# Patient Record
Sex: Male | Born: 1975 | Race: White | Hispanic: No | Marital: Single | Smoking: Current every day smoker
Health system: Southern US, Community
[De-identification: ages and names within clinical notes are randomized; demographics above are authoritative.]

## PROBLEM LIST (undated history)

## (undated) DIAGNOSIS — M1990 Unspecified osteoarthritis, unspecified site: Secondary | ICD-10-CM

## (undated) DIAGNOSIS — K219 Gastro-esophageal reflux disease without esophagitis: Secondary | ICD-10-CM

## (undated) DIAGNOSIS — I10 Essential (primary) hypertension: Secondary | ICD-10-CM

## (undated) DIAGNOSIS — E7800 Pure hypercholesterolemia, unspecified: Secondary | ICD-10-CM

## (undated) DIAGNOSIS — F329 Major depressive disorder, single episode, unspecified: Secondary | ICD-10-CM

## (undated) DIAGNOSIS — J449 Chronic obstructive pulmonary disease, unspecified: Secondary | ICD-10-CM

## (undated) DIAGNOSIS — F32A Depression, unspecified: Secondary | ICD-10-CM

## (undated) HISTORY — PX: JOINT REPLACEMENT: SHX530

## (undated) HISTORY — PX: TONSILLECTOMY: SUR1361

---

## 2007-06-15 ENCOUNTER — Ambulatory Visit: Payer: Self-pay | Admitting: Internal Medicine

## 2007-07-27 ENCOUNTER — Ambulatory Visit: Payer: Self-pay | Admitting: Internal Medicine

## 2007-12-10 ENCOUNTER — Ambulatory Visit: Payer: Self-pay | Admitting: Family Medicine

## 2007-12-31 ENCOUNTER — Ambulatory Visit: Payer: Self-pay | Admitting: Family Medicine

## 2008-01-08 ENCOUNTER — Ambulatory Visit: Payer: Self-pay | Admitting: Emergency Medicine

## 2008-01-19 ENCOUNTER — Emergency Department: Payer: Self-pay | Admitting: Emergency Medicine

## 2008-04-22 ENCOUNTER — Ambulatory Visit: Payer: Self-pay | Admitting: Family Medicine

## 2008-06-24 ENCOUNTER — Ambulatory Visit: Payer: Self-pay | Admitting: Internal Medicine

## 2008-10-05 ENCOUNTER — Ambulatory Visit: Payer: Self-pay | Admitting: Internal Medicine

## 2008-10-19 ENCOUNTER — Ambulatory Visit: Payer: Self-pay | Admitting: Internal Medicine

## 2009-02-05 ENCOUNTER — Ambulatory Visit: Payer: Self-pay | Admitting: Family Medicine

## 2009-02-24 ENCOUNTER — Ambulatory Visit: Payer: Self-pay | Admitting: Internal Medicine

## 2009-03-15 ENCOUNTER — Ambulatory Visit: Payer: Self-pay | Admitting: Orthopedic Surgery

## 2009-06-23 ENCOUNTER — Ambulatory Visit: Payer: Self-pay | Admitting: Physician Assistant

## 2009-08-05 ENCOUNTER — Ambulatory Visit: Payer: Self-pay | Admitting: Family Medicine

## 2009-10-04 ENCOUNTER — Ambulatory Visit: Payer: Self-pay | Admitting: Internal Medicine

## 2010-01-13 ENCOUNTER — Ambulatory Visit: Payer: Self-pay | Admitting: Otolaryngology

## 2010-04-25 ENCOUNTER — Ambulatory Visit: Payer: Self-pay | Admitting: Anesthesiology

## 2010-04-28 ENCOUNTER — Ambulatory Visit: Payer: Self-pay | Admitting: Otolaryngology

## 2010-05-06 ENCOUNTER — Ambulatory Visit: Payer: Self-pay | Admitting: Cardiovascular Disease

## 2010-07-10 ENCOUNTER — Ambulatory Visit: Payer: Self-pay | Admitting: Internal Medicine

## 2010-07-12 ENCOUNTER — Ambulatory Visit: Payer: Self-pay | Admitting: Internal Medicine

## 2010-11-03 ENCOUNTER — Ambulatory Visit: Payer: Self-pay | Admitting: Family Medicine

## 2010-12-02 ENCOUNTER — Ambulatory Visit: Payer: Self-pay | Admitting: Physician Assistant

## 2010-12-13 ENCOUNTER — Ambulatory Visit: Payer: Self-pay | Admitting: Psychiatry

## 2011-01-06 ENCOUNTER — Ambulatory Visit: Payer: Self-pay | Admitting: Internal Medicine

## 2011-01-17 ENCOUNTER — Ambulatory Visit: Payer: Self-pay | Admitting: Unknown Physician Specialty

## 2011-03-04 ENCOUNTER — Ambulatory Visit: Payer: Self-pay | Admitting: Internal Medicine

## 2011-04-07 ENCOUNTER — Ambulatory Visit: Payer: Self-pay | Admitting: Physician Assistant

## 2011-04-14 ENCOUNTER — Ambulatory Visit: Payer: Self-pay | Admitting: Psychiatry

## 2011-05-20 ENCOUNTER — Ambulatory Visit: Payer: Self-pay

## 2011-06-05 ENCOUNTER — Ambulatory Visit: Payer: Self-pay | Admitting: Unknown Physician Specialty

## 2011-06-05 DIAGNOSIS — I10 Essential (primary) hypertension: Secondary | ICD-10-CM

## 2011-06-08 ENCOUNTER — Ambulatory Visit: Payer: Self-pay

## 2011-06-16 ENCOUNTER — Ambulatory Visit: Payer: Self-pay | Admitting: Psychiatry

## 2011-07-17 ENCOUNTER — Inpatient Hospital Stay: Payer: Self-pay | Admitting: Unknown Physician Specialty

## 2011-07-19 LAB — PATHOLOGY REPORT

## 2011-12-26 ENCOUNTER — Ambulatory Visit: Payer: Self-pay | Admitting: Otolaryngology

## 2012-02-20 ENCOUNTER — Ambulatory Visit: Payer: Self-pay | Admitting: Psychiatry

## 2012-02-20 LAB — BASIC METABOLIC PANEL
BUN: 7 mg/dL (ref 7–18)
Chloride: 100 mmol/L (ref 98–107)
Creatinine: 1.32 mg/dL — ABNORMAL HIGH (ref 0.60–1.30)
EGFR (Non-African Amer.): >60
Glucose: 117 mg/dL — ABNORMAL HIGH (ref 65–99)
Osmolality: 275 (ref 275–301)
Potassium: 4 mmol/L (ref 3.5–5.1)
Sodium: 138 mmol/L (ref 136–145)

## 2012-02-20 LAB — CBC WITH DIFFERENTIAL/PLATELET
Basophil #: 0 10*3/uL (ref 0.0–0.1)
Basophil %: 0.5 %
Eosinophil #: 0.4 10*3/uL (ref 0.0–0.7)
Eosinophil %: 5.1 %
HGB: 15 g/dL (ref 13.0–18.0)
Lymphocyte #: 2 10*3/uL (ref 1.0–3.6)
Lymphocyte %: 24 %
Monocyte #: 0.5 x10 3/mm (ref 0.2–1.0)
Monocyte %: 5.5 %
Neutrophil %: 64.9 %
Platelet: 247 10*3/uL (ref 150–440)
RBC: 5.11 10*6/uL (ref 4.40–5.90)
RDW: 14.8 % — ABNORMAL HIGH (ref 11.5–14.5)
WBC: 8.3 10*3/uL (ref 3.8–10.6)

## 2012-02-20 LAB — SGOT (AST)(ARMC): SGOT(AST): 33 U/L (ref 15–37)

## 2012-02-20 LAB — T4, FREE: Free Thyroxine: 1.14 ng/dL (ref 0.76–1.46)

## 2012-02-20 LAB — TSH: Thyroid Stimulating Horm: 0.942 u[IU]/mL

## 2012-06-04 DIAGNOSIS — F259 Schizoaffective disorder, unspecified: Secondary | ICD-10-CM | POA: Insufficient documentation

## 2012-06-12 ENCOUNTER — Ambulatory Visit: Payer: Self-pay | Admitting: Family Medicine

## 2012-10-03 ENCOUNTER — Ambulatory Visit: Payer: Self-pay

## 2013-02-10 DIAGNOSIS — N62 Hypertrophy of breast: Secondary | ICD-10-CM | POA: Insufficient documentation

## 2013-05-07 ENCOUNTER — Ambulatory Visit: Payer: Self-pay

## 2014-02-10 ENCOUNTER — Ambulatory Visit: Payer: Self-pay | Admitting: Family Medicine

## 2014-03-04 ENCOUNTER — Emergency Department: Payer: Self-pay | Admitting: Emergency Medicine

## 2014-03-04 LAB — COMPREHENSIVE METABOLIC PANEL
ALBUMIN: 3.4 g/dL (ref 3.4–5.0)
ALK PHOS: 66 U/L
ALT: 45 U/L
ANION GAP: 5 — AB (ref 7–16)
BILIRUBIN TOTAL: 0.5 mg/dL (ref 0.2–1.0)
BUN: 4 mg/dL — ABNORMAL LOW (ref 7–18)
CALCIUM: 8.5 mg/dL (ref 8.5–10.1)
CHLORIDE: 98 mmol/L (ref 98–107)
CREATININE: 1.09 mg/dL (ref 0.60–1.30)
Co2: 30 mmol/L (ref 21–32)
EGFR (African American): >60
Glucose: 129 mg/dL — ABNORMAL HIGH (ref 65–99)
Osmolality: 265 (ref 275–301)
Potassium: 3.8 mmol/L (ref 3.5–5.1)
SGOT(AST): 25 U/L (ref 15–37)
Sodium: 133 mmol/L — ABNORMAL LOW (ref 136–145)
Total Protein: 7 g/dL (ref 6.4–8.2)

## 2014-03-04 LAB — CBC WITH DIFFERENTIAL/PLATELET
Basophil #: 0 10*3/uL (ref 0.0–0.1)
Basophil %: 0.4 %
Eosinophil #: 0.5 10*3/uL (ref 0.0–0.7)
Eosinophil %: 4.9 %
HCT: 43.9 % (ref 40.0–52.0)
HGB: 14.3 g/dL (ref 13.0–18.0)
Lymphocyte #: 2.6 10*3/uL (ref 1.0–3.6)
Lymphocyte %: 24.5 %
MCH: 30.1 pg (ref 26.0–34.0)
MCHC: 32.5 g/dL (ref 32.0–36.0)
MCV: 93 fL (ref 80–100)
MONO ABS: 0.6 x10 3/mm (ref 0.2–1.0)
Monocyte %: 5.3 %
Neutrophil #: 7 10*3/uL — ABNORMAL HIGH (ref 1.4–6.5)
Neutrophil %: 64.9 %
Platelet: 243 10*3/uL (ref 150–440)
RBC: 4.74 10*6/uL (ref 4.40–5.90)
RDW: 13.9 % (ref 11.5–14.5)
WBC: 10.8 10*3/uL — ABNORMAL HIGH (ref 3.8–10.6)

## 2014-03-04 LAB — LIPASE, BLOOD: Lipase: 101 U/L (ref 73–393)

## 2014-04-01 ENCOUNTER — Ambulatory Visit: Payer: Self-pay | Admitting: Family Medicine

## 2014-04-20 ENCOUNTER — Emergency Department: Payer: Self-pay | Admitting: Emergency Medicine

## 2014-04-20 LAB — COMPREHENSIVE METABOLIC PANEL
ALK PHOS: 59 U/L
Albumin: 3.4 g/dL (ref 3.4–5.0)
Anion Gap: 7 (ref 7–16)
BILIRUBIN TOTAL: 0.5 mg/dL (ref 0.2–1.0)
BUN: 4 mg/dL — AB (ref 7–18)
CHLORIDE: 102 mmol/L (ref 98–107)
Calcium, Total: 8.5 mg/dL (ref 8.5–10.1)
Co2: 29 mmol/L (ref 21–32)
Creatinine: 0.94 mg/dL (ref 0.60–1.30)
EGFR (African American): >60
EGFR (Non-African Amer.): >60
GLUCOSE: 101 mg/dL — AB (ref 65–99)
Osmolality: 273 (ref 275–301)
Potassium: 4.3 mmol/L (ref 3.5–5.1)
SGOT(AST): 45 U/L — ABNORMAL HIGH (ref 15–37)
SGPT (ALT): 40 U/L
SODIUM: 138 mmol/L (ref 136–145)
TOTAL PROTEIN: 7.1 g/dL (ref 6.4–8.2)

## 2014-04-20 LAB — URINALYSIS, COMPLETE
BLOOD: NEGATIVE
Bacteria: NONE SEEN
Bilirubin,UR: NEGATIVE
GLUCOSE, UR: NEGATIVE mg/dL (ref 0–75)
Ketone: NEGATIVE
LEUKOCYTE ESTERASE: NEGATIVE
Nitrite: NEGATIVE
PROTEIN: NEGATIVE
Ph: 6 (ref 4.5–8.0)
RBC,UR: NONE SEEN /HPF (ref 0–5)
SPECIFIC GRAVITY: 1.004 (ref 1.003–1.030)
Squamous Epithelial: NONE SEEN
WBC UR: 1 /HPF (ref 0–5)

## 2014-04-20 LAB — CBC
HCT: 41.5 % (ref 40.0–52.0)
HGB: 14 g/dL (ref 13.0–18.0)
MCH: 30.5 pg (ref 26.0–34.0)
MCHC: 33.6 g/dL (ref 32.0–36.0)
MCV: 91 fL (ref 80–100)
Platelet: 261 10*3/uL (ref 150–440)
RBC: 4.58 10*6/uL (ref 4.40–5.90)
RDW: 13.5 % (ref 11.5–14.5)
WBC: 8.5 10*3/uL (ref 3.8–10.6)

## 2014-11-29 NOTE — Discharge Summary (Signed)
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   DISCHARGE DIAGNOSES:  1. 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  2. Acute blood loss anemia, improved after transfusion.  3. Hyponatremia, improved.   ATTENDING: Erin Sons, MD   PROCEDURE: On 07/17/2011 the patient underwent left total hip arthroplasty by Dr. Gavin Potters with assistant Thompson Grayer, PA-C.   ANESTHESIA: General with spinal anesthetic.  ESTIMATED BLOOD LOSS: 1200 mL.   OPERATIVE FINDINGS: Avascular necrosis.   IMPLANTS: [employer]ry[employer].  DRAINS: One Hemovac drain.  HISTORY OF PRESENT ILLNESS: Richard Davis is a 39 year old who had left upper leg/knee pain for quite some time. He had a left knee arthroscopy in June of 2012 that failed to relieve his symptoms. Later on x-rays of his hips were taken and did look somewhat suspicious. MRI revealed AVN of both hips. [facility] felt he was too far gone to have a vascularized fibula graft procedure. The patient is to undergo hip replacement. He had a prior left hip injection which was somewhat helpful.   ALLERGIES: No known drug allergies.    PAST MEDICAL HISTORY:  1. Schizophrenia. 2. Gastroesophageal reflux disease. 3. Migraines.  4. High cholesterol.  5. Hypertension.  6. Allergies. 7. Arthritis.  8. Chickenpox.  9. Recent sinusitis. 10. On chronic methadone, treated by a pain clinic in [place].  PHYSICAL EXAMINATION: HEART: S1 and S2 with increased rate but regular rhythm. LUNGS: Posterior fields clear to auscultation. LEFT HIP: Passive internal rotation is approximately 20 degrees with stiffness and external rotation is about 30 degrees with pain. Distally he has a good pulse and is able to move his ankle well.   HOSPITAL COURSE: The patient underwent the aforementioned  procedure without complication on 07/17/2011 and was transferred to the PACU and then the orthopedic floor in stable condition. He had some significant pain at first that would abate it seems. His IV fluids were switched because of his hyponatremia, to normal saline. At one point he was put on fluid restriction, but his sodium would normalize and this was taken off. He was treated with Lovenox, TED hose, and compression boots for deep vein thrombosis prophylaxis. He was ambulatory while here with physical therapy. His wound drain was pulled on postoperative day two and his dressing was changed on postoperative day three. There was no sign of infection about his wound site. His hemoglobin had dropped down to 6.3 on 07/19/2011 and he was transfused 2 units of packed red blood cells. Hemoglobin would be 8.1 on 07/20/2011.  He was started on iron and vitamin D supplement. The patient did work multiple times with physical therapy and also some with occupational therapy while here. On 07/21/2011 he ambulated 150 feet and worked on stairs.   CONDITION AT DISCHARGE: Stable.   DISPOSITION: Home with home health physical therapy.   DISCHARGE MEDICATIONS:  1. Oxycodone 5 to 10 mg every four hours as needed for pain.  2. Lovenox 40 mg subcutaneous injection once a day. 3. Methadone 10 mg/5 mL oral solution 1000 mL, take 30 mL orally every morning. This prescription will last him for 33 days. After this he needs to go back to the pain clinic in [place].   DISCHARGE INSTRUCTIONS AND FOLLOW-UP: 1. He may need to take a stool softener to have regular bowel movements.  2. He will  not resume naproxen until finishing Lovenox injections.  3. He is weight-bearing as tolerated on the surgical leg.  4. He will wear knee-high TED hose during the day.  5. Regular diet.  6. He may ice his left hip but will get his dressing dirty or wet. He will leave the dressing on.  7. He will call our office if he has any disturbing  symptoms.  8. He will not cross his left leg over or flex his hip to his chest.  9. He is to discontinue smoking to expedite his healing process.  10. [facility] for a follow-up appointment. ____________________________ Letta MoynahanJonathan R. Moani Weipert, [place] jrp:slb D: 07/21/2011 12:03:32 ET T: 07/21/2011 12:37:23 ET JOB#: [id number]  cc: Letta MoynahanJonathan R. Clyde CanterburyPrentice, GeorgiaPA, <Dictator> Letta MoynahanJONATHAN R Shaquasia Caponigro PA ELECTRONICALLY SIGNED 08/08/2011 9:23

## 2014-12-31 DIAGNOSIS — R0602 Shortness of breath: Secondary | ICD-10-CM | POA: Insufficient documentation
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
## 2015-01-27 ENCOUNTER — Ambulatory Visit
Admission: RE | Admit: 2015-01-27 | Discharge: 2015-01-27 | Disposition: A | Discharge status: Home / Self Care | Payer: Medicare Other | Source: Ambulatory Visit | Attending: Cardiology | Admitting: Cardiology

## 2015-01-27 ENCOUNTER — Encounter: Payer: Self-pay | Admitting: *Deleted

## 2015-01-27 ENCOUNTER — Encounter
Admission: RE | Disposition: A | Discharge status: Home / Self Care | Payer: Self-pay | Source: Ambulatory Visit | Attending: Cardiology

## 2015-01-27 DIAGNOSIS — R9439 Abnormal result of other cardiovascular function study: Secondary | ICD-10-CM

## 2015-01-27 DIAGNOSIS — K219 Gastro-esophageal reflux disease without esophagitis: Secondary | ICD-10-CM | POA: Insufficient documentation

## 2015-01-27 DIAGNOSIS — G8929 Other chronic pain: Secondary | ICD-10-CM | POA: Diagnosis not present

## 2015-01-27 DIAGNOSIS — I2584 Coronary atherosclerosis due to calcified coronary lesion: Secondary | ICD-10-CM | POA: Diagnosis not present

## 2015-01-27 DIAGNOSIS — Z79899 Other long term (current) drug therapy: Secondary | ICD-10-CM | POA: Diagnosis not present

## 2015-01-27 DIAGNOSIS — R079 Chest pain, unspecified: Secondary | ICD-10-CM | POA: Diagnosis present

## 2015-01-27 DIAGNOSIS — E785 Hyperlipidemia, unspecified: Secondary | ICD-10-CM | POA: Diagnosis not present

## 2015-01-27 DIAGNOSIS — R0602 Shortness of breath: Secondary | ICD-10-CM | POA: Diagnosis not present

## 2015-01-27 DIAGNOSIS — J329 Chronic sinusitis, unspecified: Secondary | ICD-10-CM | POA: Insufficient documentation

## 2015-01-27 DIAGNOSIS — Z7952 Long term (current) use of systemic steroids: Secondary | ICD-10-CM | POA: Insufficient documentation

## 2015-01-27 DIAGNOSIS — Z7951 Long term (current) use of inhaled steroids: Secondary | ICD-10-CM | POA: Diagnosis not present

## 2015-01-27 DIAGNOSIS — E780 Pure hypercholesterolemia: Secondary | ICD-10-CM | POA: Diagnosis not present

## 2015-01-27 DIAGNOSIS — I2510 Atherosclerotic heart disease of native coronary artery without angina pectoris: Secondary | ICD-10-CM | POA: Diagnosis not present

## 2015-01-27 DIAGNOSIS — I10 Essential (primary) hypertension: Secondary | ICD-10-CM | POA: Diagnosis not present

## 2015-01-27 DIAGNOSIS — M2550 Pain in unspecified joint: Secondary | ICD-10-CM | POA: Insufficient documentation

## 2015-01-27 DIAGNOSIS — Z79891 Long term (current) use of opiate analgesic: Secondary | ICD-10-CM | POA: Diagnosis not present

## 2015-01-27 DIAGNOSIS — Z7982 Long term (current) use of aspirin: Secondary | ICD-10-CM | POA: Diagnosis not present

## 2015-01-27 DIAGNOSIS — Z720 Tobacco use: Secondary | ICD-10-CM | POA: Insufficient documentation

## 2015-01-27 DIAGNOSIS — F17210 Nicotine dependence, cigarettes, uncomplicated: Secondary | ICD-10-CM | POA: Diagnosis not present

## 2015-01-27 HISTORY — DX: Gastro-esophageal reflux disease without esophagitis: K21.9

## 2015-01-27 HISTORY — DX: Depression, unspecified: F32.A

## 2015-01-27 HISTORY — DX: Essential (primary) hypertension: I10

## 2015-01-27 HISTORY — PX: CARDIAC CATHETERIZATION: SHX172

## 2015-01-27 HISTORY — DX: Major depressive disorder, single episode, unspecified: F32.9

## 2015-01-27 LAB — GLUCOSE, CAPILLARY: Glucose-Capillary: 127 mg/dL — ABNORMAL HIGH (ref 65–99)

## 2015-01-27 SURGERY — LEFT HEART CATH
Anesthesia: Moderate Sedation

## 2015-01-27 MED ORDER — SODIUM CHLORIDE 0.9 % IJ SOLN
3.0000 mL | Freq: Two times a day (BID) | INTRAMUSCULAR | Status: DC
Start: 2015-01-27 — End: 2015-01-27

## 2015-01-27 MED ORDER — SODIUM CHLORIDE 0.9 % IJ SOLN: 3.0000 mL | INTRAMUSCULAR | Status: DC | PRN

## 2015-01-27 MED ORDER — FENTANYL CITRATE (PF) 100 MCG/2ML IJ SOLN
INTRAMUSCULAR | Status: DC | PRN
  Administered 2015-01-27: 50 ug via INTRAVENOUS
  Administered 2015-01-27: 25 ug via INTRAVENOUS

## 2015-01-27 MED ORDER — ACETAMINOPHEN 325 MG PO TABS: 650.0000 mg | ORAL_TABLET | ORAL | Status: DC | PRN

## 2015-01-27 MED ORDER — MIDAZOLAM HCL 2 MG/2ML IJ SOLN
INTRAMUSCULAR | Status: DC | PRN
  Administered 2015-01-27: 2 mg via INTRAVENOUS

## 2015-01-27 MED ORDER — SODIUM CHLORIDE 0.9 % IJ SOLN: 3.0000 mL | Freq: Two times a day (BID) | INTRAMUSCULAR | Status: DC

## 2015-01-27 MED ORDER — ONDANSETRON HCL 4 MG/2ML IJ SOLN: 4.0000 mg | Freq: Four times a day (QID) | INTRAMUSCULAR | Status: DC | PRN

## 2015-01-27 MED ORDER — SODIUM CHLORIDE 0.9 % WEIGHT BASED INFUSION: 3.0000 mL/kg/h | INTRAVENOUS | Status: DC

## 2015-01-27 MED ORDER — SODIUM CHLORIDE 0.9 % IV SOLN
INTRAVENOUS | Status: DC
  Administered 2015-01-27: 08:00:00 via INTRAVENOUS

## 2015-01-27 MED ORDER — HEPARIN (PORCINE) IN NACL 2-0.9 UNIT/ML-% IJ SOLN
INTRAMUSCULAR | Status: AC
  Filled 2015-01-27: qty 500

## 2015-01-27 MED ORDER — MIDAZOLAM HCL 2 MG/2ML IJ SOLN
INTRAMUSCULAR | Status: AC
  Filled 2015-01-27: qty 2

## 2015-01-27 MED ORDER — FENTANYL CITRATE (PF) 100 MCG/2ML IJ SOLN
INTRAMUSCULAR | Status: AC
  Filled 2015-01-27: qty 2

## 2015-01-27 MED ORDER — SODIUM CHLORIDE 0.9 % IV SOLN: 250.0000 mL | INTRAVENOUS | Status: DC | PRN

## 2015-01-27 MED ORDER — IOHEXOL 300 MG/ML  SOLN
INTRAMUSCULAR | Status: DC | PRN
  Administered 2015-01-27: 30 mL via INTRA_ARTERIAL
  Administered 2015-01-27: 75 mL via INTRA_ARTERIAL

## 2015-01-27 SURGICAL SUPPLY — 9 items
CATH INFINITI 5FR ANG PIGTAIL (CATHETERS) ×3
CATH INFINITI 5FR JL4 (CATHETERS) ×3
CATH INFINITI JR4 5F (CATHETERS) ×3
DEVICE CLOSURE MYNXGRIP 5F (Vascular Products) ×3 IMPLANT
KIT MANI 3VAL PERCEP (MISCELLANEOUS) ×3
NEEDLE PERC 18GX7CM (NEEDLE) ×3
PACK CARDIAC CATH (CUSTOM PROCEDURE TRAY) ×3
SHEATH AVANTI 5FR X 11CM (SHEATH) ×3
WIRE EMERALD 3MM-J .035X150CM (WIRE) ×3

## 2015-01-27 NOTE — Discharge Instructions (Signed)

## 2015-02-03 DIAGNOSIS — Z9889 Other specified postprocedural states: Secondary | ICD-10-CM | POA: Insufficient documentation

## 2015-02-03 DIAGNOSIS — F1120 Opioid dependence, uncomplicated: Secondary | ICD-10-CM | POA: Insufficient documentation

## 2015-02-20 ENCOUNTER — Encounter: Payer: Self-pay | Admitting: Gynecology

## 2015-02-20 ENCOUNTER — Ambulatory Visit
Admission: EM | Admit: 2015-02-20 | Discharge: 2015-02-20 | Disposition: A | Discharge status: Home / Self Care | Payer: Medicare Other | Attending: Family Medicine | Admitting: Family Medicine

## 2015-02-20 DIAGNOSIS — Z7982 Long term (current) use of aspirin: Secondary | ICD-10-CM | POA: Diagnosis not present

## 2015-02-20 DIAGNOSIS — F329 Major depressive disorder, single episode, unspecified: Secondary | ICD-10-CM | POA: Insufficient documentation

## 2015-02-20 DIAGNOSIS — Z8619 Personal history of other infectious and parasitic diseases: Secondary | ICD-10-CM

## 2015-02-20 DIAGNOSIS — W57XXXA Bitten or stung by nonvenomous insect and other nonvenomous arthropods, initial encounter: Secondary | ICD-10-CM | POA: Diagnosis not present

## 2015-02-20 DIAGNOSIS — S90562A Insect bite (nonvenomous), left ankle, initial encounter: Secondary | ICD-10-CM | POA: Diagnosis present

## 2015-02-20 DIAGNOSIS — T148 Other injury of unspecified body region: Secondary | ICD-10-CM

## 2015-02-20 DIAGNOSIS — K219 Gastro-esophageal reflux disease without esophagitis: Secondary | ICD-10-CM | POA: Diagnosis not present

## 2015-02-20 DIAGNOSIS — I10 Essential (primary) hypertension: Secondary | ICD-10-CM | POA: Insufficient documentation

## 2015-02-20 DIAGNOSIS — F17210 Nicotine dependence, cigarettes, uncomplicated: Secondary | ICD-10-CM | POA: Insufficient documentation

## 2015-02-20 LAB — BASIC METABOLIC PANEL
Anion gap: 11 (ref 5–15)
BUN: 6 mg/dL (ref 6–20)
CO2: 30 mmol/L (ref 22–32)
Calcium: 9.1 mg/dL (ref 8.9–10.3)
Chloride: 91 mmol/L — ABNORMAL LOW (ref 101–111)
Creatinine, Ser: 0.92 mg/dL (ref 0.61–1.24)
GFR calc Af Amer: >60 mL/min (ref >60)
GFR calc non Af Amer: >60 mL/min (ref >60)
Glucose, Bld: 113 mg/dL — ABNORMAL HIGH (ref 65–99)
Potassium: 3.7 mmol/L (ref 3.5–5.1)
Sodium: 132 mmol/L — ABNORMAL LOW (ref 135–145)

## 2015-02-20 LAB — CBC WITH DIFFERENTIAL/PLATELET
Basophils Absolute: 0.1 10*3/uL (ref 0–0.1)
Basophils Relative: 1 %
Eosinophils Absolute: 0.2 10*3/uL (ref 0–0.7)
Eosinophils Relative: 3 %
HCT: 40.8 % (ref 40.0–52.0)
Hemoglobin: 14.3 g/dL (ref 13.0–18.0)
Lymphocytes Relative: 29 %
Lymphs Abs: 2.8 10*3/uL (ref 1.0–3.6)
MCH: 31 pg (ref 26.0–34.0)
MCHC: 35.1 g/dL (ref 32.0–36.0)
MCV: 88.4 fL (ref 80.0–100.0)
Monocytes Absolute: 0.7 10*3/uL (ref 0.2–1.0)
Monocytes Relative: 8 %
Neutro Abs: 5.6 10*3/uL (ref 1.4–6.5)
Neutrophils Relative %: 59 %
Platelets: 224 10*3/uL (ref 150–440)
RBC: 4.62 MIL/uL (ref 4.40–5.90)
RDW: 13.5 % (ref 11.5–14.5)
WBC: 9.5 10*3/uL (ref 3.8–10.6)

## 2015-02-20 MED ORDER — DOXYCYCLINE HYCLATE 100 MG PO CAPS: 100.0000 mg | ORAL_CAPSULE | Freq: Two times a day (BID) | ORAL | Status: DC

## 2015-02-20 NOTE — Discharge Instructions (Signed)
DEET Insect Repellent  °DEET is a commonly used insect repellent. DEET is effective against mosquitoes, ticks, and chiggers. DEET is not effective against stinging insects, such as bees and wasps. When mosquitoes or ticks are active, take the following precautions. °· Use DEET according to the directions on the label. °· Wear protective clothing if you are outside in an area where there are weeds, tall grass, or bushes. This includes long pants, socks, and loose-fitting, long-sleeved shirts. Consider spraying DEET on your clothing. Avoid being outdoors in the early evening. This is when mosquitoes are most active. °· Products with a low concentration of DEET (10% to 20%) may be useful in areas with few insects. Higher concentrations of DEET may be needed in areas with many insects. Repellents used on children should not contain more than 30% DEET. Although higher concentrations of DEET (up to 95%) are available for adults, they are not recommended for routine use. Concentrations higher than 50% do not provide additional protection. Depending on the concentration of DEET in a product, it can be effective for about 2 to 6 hours. °· When applying DEET to children, use the lowest concentration that is effective. Ten percent DEET will last approximately 2 to 3 hours, while 30% will last 4 to 5 hours. Do not use DEET on infants younger than 2 months old. Do not apply DEET more often than once a day to children under the age of 2. °· Avoid prolonged or excessive use of DEET. Use it sparingly to cover exposed skin and clothing. Adverse reactions to DEET in the recommended concentrations are uncommon. However, skin irritation can occur in some people. °· Wash all treated skin and clothing with soap and water after returning indoors. °· Do not allow children to apply insect repellent themselves. °· Do not apply DEET near cuts or open wounds. You can apply DEET and sunscreen together. However, it is recommend that you apply  the sunscreen first. °· Do not apply DEET to a child's hands or near a child's eyes and mouth. If DEET is accidentally sprayed in the eyes, wash the eyes out with large amounts of water. °· Store DEET out of the reach of children. °· Most authorities feel that it is safe to use DEET during pregnancy. However, pregnant women should only use insect repellents when they are in areas with a high risk of disease carried by insects (malaria, West Nile virus, encephalitis). °Document Released: 04/18/2001 Document Revised: 12/08/2013 Document Reviewed: 04/12/2011 °ExitCare® Patient Information ©2015 [employer]. This information is not intended to replace advice given to you by your health care provider. Make sure you discuss any questions you have with your health care provider. °Insect Bite °Mosquitoes, flies, fleas, bedbugs, and many other insects can bite. Insect bites are different from insect stings. A sting is when venom is injected into the skin. Some insect bites can transmit infectious diseases. °SYMPTOMS  °Insect bites usually turn red, swell, and itch for 2 to 4 days. They often go away on their own. °TREATMENT  °Your caregiver may prescribe antibiotic medicines if a bacterial infection develops in the bite. °HOME CARE INSTRUCTIONS °· Do not scratch the bite area. °· Keep the bite area clean and dry. Wash the bite area thoroughly with soap and water. °· Put ice or cool compresses on the bite area. °¨ Put ice in a plastic bag. °¨ Place a towel between your skin and the bag. °¨ Leave the ice on for 20 minutes, 4 times a day for   the first 2 to 3 days, or as directed. °· You may apply a baking soda paste, cortisone cream, or calamine lotion to the bite area as directed by your caregiver. This can help reduce itching and swelling. °· Only take over-the-counter or prescription medicines as directed by your caregiver. °· If you are given antibiotics, take them as directed. Finish them even if you start to feel  better. °You may need a tetanus shot if: °· You cannot remember when you had your last tetanus shot. °· You have never had a tetanus shot. °· The injury broke your skin. °If you get a tetanus shot, your arm may swell, get red, and feel warm to the touch. This is common and not a problem. If you need a tetanus shot and you choose not to have one, there is a rare chance of getting tetanus. Sickness from tetanus can be serious. °SEEK IMMEDIATE MEDICAL CARE IF:  °· You have increased pain, redness, or swelling in the bite area. °· You see a red line on the skin coming from the bite. °· You have a fever. °· You have joint pain. °· You have a headache or neck pain. °· You have unusual weakness. °· You have a rash. °· You have chest pain or shortness of breath. °· You have abdominal pain, nausea, or vomiting. °· You feel unusually tired or sleepy. °MAKE SURE YOU:  °· Understand these instructions. °· Will watch your condition. °· Will get help right away if you are not doing well or get worse. °Document Released: 08/31/2004 Document Revised: 10/16/2011 Document Reviewed: 02/22/2011 °ExitCare® Patient Information ©2015 [employer]. This information is not intended to replace advice given to you by your health care provider. Make sure you discuss any questions you have with your health care provider. ° °

## 2015-02-20 NOTE — ED Notes (Signed)
Per patient believes he has been bitten by a tick. Reports he has spots on left foot for about 1-2 weeks. No itching or pain.
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
## 2015-02-23 ENCOUNTER — Emergency Department
Admission: EM | Admit: 2015-02-23 | Discharge: 2015-02-23 | Disposition: A | Discharge status: Home / Self Care | Payer: Medicare Other | Attending: Emergency Medicine | Admitting: Emergency Medicine

## 2015-02-23 ENCOUNTER — Encounter: Payer: Self-pay | Admitting: Emergency Medicine

## 2015-02-23 DIAGNOSIS — B356 Tinea cruris: Secondary | ICD-10-CM | POA: Diagnosis not present

## 2015-02-23 DIAGNOSIS — I10 Essential (primary) hypertension: Secondary | ICD-10-CM | POA: Insufficient documentation

## 2015-02-23 DIAGNOSIS — Z720 Tobacco use: Secondary | ICD-10-CM | POA: Diagnosis not present

## 2015-02-23 DIAGNOSIS — R21 Rash and other nonspecific skin eruption: Secondary | ICD-10-CM | POA: Diagnosis present

## 2015-02-23 LAB — B. BURGDORFI ANTIBODIES: B burgdorferi Ab IgG+IgM: <0.91 {ISR} (ref 0.00–0.90)

## 2015-02-23 LAB — ROCKY MTN SPOTTED FVR ABS PNL(IGG+IGM)
RMSF IgG: POSITIVE — AB
RMSF IgM: 0.24 index (ref 0.00–0.89)

## 2015-02-23 LAB — RMSF, IGG, IFA: RMSF, IGG, IFA: 1:128 {titer} — ABNORMAL HIGH

## 2015-02-23 MED ORDER — KETOCONAZOLE 2 % EX CREA: 1.0000 "application " | TOPICAL_CREAM | Freq: Two times a day (BID) | CUTANEOUS | Status: DC

## 2015-02-23 NOTE — Discharge Instructions (Signed)
Jock Itch Jock itch is a fungal infection of the skin in the groin area. It is sometimes called "ringworm" even though it is not caused by a worm. A fungus is a type of germ that thrives in dark, damp places.  CAUSES  This infection may spread from:  A fungus infection elsewhere on the body (such as athlete's foot).  Sharing towels or clothing. This infection is more common in:  Hot, humid climates.  People who wear tight-fitting clothing or wet bathing suits for long periods of time.  Athletes.  Overweight people.  People with diabetes. SYMPTOMS  Jock itch causes the following symptoms:  Red, pink or brown rash in the groin. Rash may spread to the thighs, anus, and buttocks.  Itching. DIAGNOSIS  Your caregiver may make the diagnosis by looking at the rash. Sometimes a skin scraping will be sent to test for fungus. Testing can be done either by looking under the microscope or by doing a culture (test to try to grow the fungus). A culture can take up to 2 weeks to come back. TREATMENT  Jock itch may be treated with:  Skin cream or ointment to kill fungus.  Medicine by mouth to kill fungus.  Skin cream or ointment to calm the itching.  Compresses or medicated powders to dry the infected skin. HOME CARE INSTRUCTIONS   Be sure to treat the rash completely. Follow your caregiver's instructions. It can take a couple of weeks to treat. If you do not treat the infection long enough, the rash can come back.  Wear loose-fitting clothing.  Men should wear cotton boxer shorts.  Women should wear cotton underwear.  Avoid hot baths.  Dry the groin area well after bathing. SEEK MEDICAL CARE IF:   Your rash is worse.  Your rash is spreading.  Your rash returns after treatment is finished.  Your rash is not gone in 4 weeks. Fungal infections are slow to respond to treatment. Some redness may remain for several weeks after the fungus is gone. SEEK IMMEDIATE MEDICAL CARE  IF:  The area becomes red, warm, tender, and swollen.  You have a fever. Document Released: 07/14/2002 Document Revised: 10/16/2011 Document Reviewed: 06/12/2008 ExitCare Patient Information 2015 [employer]. This information is not intended to replace advice given to you by your health care provider. Make sure you discuss any questions you have with your health care provider.  

## 2015-02-23 NOTE — ED Provider Notes (Signed)
[facility] Emergency Department Provider Note ____________________________________________  Time seen: 1545  I have reviewed the triage vital signs and the nursing notes.  HISTORY  Chief Complaint  Rash  HPI Richard Davis is a 39 y.o. male 8 over rash to the left side of his groin for the last several days. He reports the rash is itchy at times, and notes that it is worse when he sweats. He had concerns that it was due to a cardiac catheterization done on June 22, eyes any drainage, discharge, or induration in the area. He has not tried any over-the-counter creams and/or powders for symptom relief. He is here reporting he is currently taking doxycycline and clindamycin concurrently for two separate indications. He denies any current fevers, nausea, vomiting, discharge.  Past Medical History  Diagnosis Date  . Hypertension   . GERD (gastroesophageal reflux disease)   . Depression     Patient Active Problem List   Diagnosis Date Noted  . Chest pain with high risk for cardiac etiology 01/27/2015  . Abnormal nuclear stress test 01/27/2015  . Chronic pain 01/27/2015  . Acid reflux 01/27/2015  . HLD (hyperlipidemia) 01/27/2015  . BP (high blood pressure) 01/27/2015  . Ache in joint 01/27/2015  . Sinus infection 01/27/2015  . Current tobacco use 01/27/2015  . Breathlessness on exertion 12/31/2014  . Breast development in males 02/10/2013  . Schizo-affective psychosis 06/04/2012    Past Surgical History  Procedure Laterality Date  . Tonsillectomy    . Joint replacement    . Cardiac catheterization N/A 01/27/2015    Procedure: Left Heart Cath;  Surgeon: Marcina Millard, MD;  Location: [facility] INVASIVE CV LAB;  Service: Cardiovascular;  Laterality: N/A;    Current Outpatient Rx  Name  Route  Sig  Dispense  Refill  . albuterol (PROVENTIL HFA;VENTOLIN HFA) 108 (90 BASE) MCG/ACT inhaler   Inhalation   Inhale 2 puffs into the lungs every 6 (six) hours as  needed for wheezing or shortness of breath.         Marland Kitchen amitriptyline (ELAVIL) 100 MG tablet   Oral   Take 100 mg by mouth at bedtime.         Marland Kitchen aspirin EC 81 MG tablet   Oral   Take 81 mg by mouth daily.         . cabergoline (DOSTINEX) 0.5 MG tablet   Oral   Take 0.25 mg by mouth 2 (two) times a week.         . doxycycline (VIBRAMYCIN) 100 MG capsule   Oral   Take 1 capsule (100 mg total) by mouth 2 (two) times daily.   20 capsule   0   . fluPHENAZine (PROLIXIN) 5 MG tablet   Oral   Take 5 mg by mouth daily.         . fluticasone (FLONASE) 50 MCG/ACT nasal spray   Each Nare   Place 2 sprays into both nostrils daily.         Marland Kitchen gabapentin (NEURONTIN) 300 MG capsule   Oral   Take 300 mg by mouth 3 (three) times daily.         . haloperidol (HALDOL) 10 MG tablet   Oral   Take 10 mg by mouth at bedtime.         . haloperidol (HALDOL) 5 MG tablet   Oral   Take 5 mg by mouth every morning.         . isosorbide  mononitrate (IMDUR) 30 MG 24 hr tablet   Oral   Take 30 mg by mouth daily.         Marland Kitchen. ketoconazole (NIZORAL) 2 % cream   Topical   Apply 1 application topically 2 (two) times daily.   30 g   0   . methazolamide (NEPTAZANE) 50 MG tablet   Oral   Take 175 mg by mouth daily.         . simvastatin (ZOCOR) 40 MG tablet   Oral   Take 40 mg by mouth daily.         . traZODone (DESYREL) 100 MG tablet   Oral   Take 100 mg by mouth at bedtime.           Allergies Review of patient's allergies indicates no known allergies.  No family history on file.  Social History History  Substance Use Topics  . Smoking status: Current Every Day Smoker -- 2.00 packs/day for 15 years    Types: Cigarettes  . Smokeless tobacco: Current User  . Alcohol Use: No   Review of Systems  Constitutional: Negative for fever. Eyes: Negative for visual changes. ENT: Negative for sore throat. Cardiovascular: Negative for chest pain. Respiratory:  Negative for shortness of breath. Gastrointestinal: Negative for abdominal pain, vomiting and diarrhea. Genitourinary: Negative for dysuria. Musculoskeletal: Negative for back pain. Skin: Positive for rash. Neurological: Negative for headaches, focal weakness or numbness. ____________________________________________  PHYSICAL EXAM:  VITAL SIGNS: ED Triage Vitals  Enc Vitals Group     BP 02/23/15 1513 139/91 mmHg     Pulse Rate 02/23/15 1513 125     Resp 02/23/15 1513 19     Temp 02/23/15 1513 97.9 F (36.6 C)     Temp Source 02/23/15 1513 Oral     SpO2 02/23/15 1513 92 %     Weight 02/23/15 1513 240 lb (108.863 kg)     Height 02/23/15 1513 5\' 10"  (1.778 m)     Head Cir --      Peak Flow --      Pain Score 02/23/15 1514 2     Pain Loc --      Pain Edu? --      Excl. in GC? --    Constitutional: Alert and oriented. Well appearing and in no distress. Eyes: Conjunctivae are normal. PERRL. Normal extraocular movements. ENT   Head: Normocephalic and atraumatic.   Nose: No congestion/rhinnorhea.   Mouth/Throat: Mucous membranes are moist.   Neck: Supple. No thyromegaly. Hematological/Lymphatic/Immunilogical: No cervical lymphadenopathy. Respiratory: Normal respiratory effort.  Gastrointestinal: Soft and nontender. No distention. GU: Deferred Musculoskeletal: Nontender with normal range of motion in all extremities.  Neurologic:  Normal gait without ataxia. Normal speech and language. No gross focal neurologic deficits are appreciated. Skin:  Skin is warm, dry and intact. Erythematous, bright red, macular rash to the intertriginous skin of the right groin and upper thigh. No blisters, induration, or excoriations noted.  Psychiatric: Mood and affect are normal. Patient exhibits appropriate insight and judgment. ____________________________________________  INITIAL IMPRESSION / ASSESSMENT AND PLAN / ED COURSE  Tinea of the groin. Treatment with Ketoconazole 2% cream.  Follow-up with primary provider.  Instructions on management of tinea given.  ____________________________________________  FINAL CLINICAL IMPRESSION(S) / ED DIAGNOSES  Final diagnoses:  Tinea of groin     Lissa HoardJenise V Bacon Staci Dack, PA-C 02/23/15 1628  Richardean Canalavid H Yao, MD 02/23/15 [phone]

## 2015-02-23 NOTE — ED Notes (Signed)
Pt reports rash to right groin area. Pt also reports being treated with tick fever by urgent care with doxycycline and a tooth abscess with clindamycin. Pt states he's unsure if he took tylenol earlier, reports low grade ever at home. Pt is diaphoretic, reports it's his normal.

## 2015-02-25 ENCOUNTER — Telehealth: Payer: Self-pay | Admitting: Emergency Medicine

## 2015-02-25 NOTE — ED Notes (Signed)
Patient RSMF test came back positive.  Patient was treated with Doxycycline. Patient also has history of positive RMSF a year ago. The Communicable Disease form was faxed to [facility] Department.  Received confirmation the fax went through.

## 2015-06-10 ENCOUNTER — Encounter: Payer: Self-pay | Admitting: *Deleted

## 2015-06-16 NOTE — Discharge Instructions (Signed)
[facility] °[facility] °ENDOSCOPIC SINUS SURGERY °[facility] ° °What is Functional Endoscopic Sinus Surgery? ° The Surgery involves making the natural openings of the sinuses larger by removing the bony partitions that separate the sinuses from the nasal cavity.  The natural sinus lining is preserved as much as possible to allow the sinuses to resume normal function after the surgery.  In some patients nasal polyps (excessively swollen lining of the sinuses) may be removed to relieve obstruction of the sinus openings.  The surgery is performed through the nose using lighted scopes, which eliminates the need for incisions on the face.  A septoplasty is a different procedure which is sometimes performed with sinus surgery.  It involves straightening the boy partition that separates the two sides of your nose.  A crooked or deviated septum may need repair if is obstructing the sinuses or nasal airflow.  Turbinate reduction is also often performed during sinus surgery.  The turbinates are bony proturberances from the side walls of the nose which swell and can obstruct the nose in patients with sinus and allergy problems.  Their size can be surgically reduced to help relieve nasal obstruction. ° °What Can Sinus Surgery Do For Me? ° Sinus surgery can reduce the frequency of sinus infections requiring antibiotic treatment.  This can provide improvement in nasal congestion, post-nasal drainage, facial pressure and nasal obstruction.  Surgery will NOT prevent you from ever having an infection again, so it usually only for patients who get infections 4 or more times yearly requiring antibiotics, or for infections that do not clear with antibiotics.  It will not cure nasal allergies, so patients with allergies may still require medication to treat their allergies after surgery. Surgery may improve headaches related to sinusitis, however, some people will continue to  require medication to control sinus headaches related to allergies.  Surgery will do nothing for other forms of headache (migraine, tension or cluster). ° °What Are the Risks of Endoscopic Sinus Surgery? ° Current techniques allow surgery to be performed safely with little risk, however, there are rare complications that patients should be aware of.  Because the sinuses are located around the eyes, there is risk of eye injury, including blindness, though again, this would be quite rare. This is usually a result of bleeding behind the eye during surgery, which puts the vision oat risk, though there are treatments to protect the vision and prevent permanent disrupted by surgery causing a leak of the spinal fluid that surrounds the brain.  More serious complications would include bleeding inside the brain cavity or damage to the brain.  Again, all of these complications are uncommon, and spinal fluid leaks can be safely managed surgically if they occur.  The most common complication of sinus surgery is bleeding from the nose, which may require packing or cauterization of the nose.  Continued sinus have polyps may experience recurrence of the polyps requiring revision surgery.  Alterations of sense of smell or injury to the tear ducts are also rare complications.  ° °What is the Surgery Like, and what is the Recovery? ° The Surgery usually takes a couple of hours to perform, and is usually performed under a general anesthetic (completely asleep).  Patients are usually discharged home after a couple of hours.  Sometimes during surgery it is necessary to pack the nose to control bleeding, and the packing is left in place for 24 - 48 hours, and removed by your surgeon.    If a septoplasty was performed during the procedure, there is often a splint placed which must be removed after 5-7 days.   °Discomfort: Pain is usually mild to moderate, and can be controlled by prescription pain medication or acetaminophen (Tylenol).   Aspirin, Ibuprofen (Advil, Motrin), or Naprosyn (Aleve) should be avoided, as they can cause increased bleeding.  Most patients feel sinus pressure like they have a bad head cold for several days.  Sleeping with your head elevated can help reduce swelling and facial pressure, as can ice packs over the face.  A humidifier may be helpful to keep the mucous and blood from drying in the nose.  ° °Diet: There are no specific diet restrictions, however, you should generally start with clear liquids and a light diet of bland foods because the anesthetic can cause some nausea.  Advance your diet depending on how your stomach feels.  Taking your pain medication with food will often help reduce stomach upset which pain medications can cause. ° °Nasal Saline Irrigation: It is important to remove blood clots and dried mucous from the nose as it is healing.  This is done by having you irrigate the nose at least 3 - 4 times daily with a salt water solution.  We recommend using NeilMed Sinus Rinse (available at the drug store).  Fill the squeeze bottle with the solution, bend over a sink, and insert the tip of the squeeze bottle into the nose ½ of an inch.  Point the tip of the squeeze bottle towards the inside corner of the eye on the same side your irrigating.  Squeeze the bottle and gently irrigate the nose.  If you bend forward as you do this, most of the fluid will flow back out of the nose, instead of down your throat.   The solution should be warm, near body temperature, when you irrigate.   Each time you irrigate, you should use a full squeeze bottle.  ° °Note that if you are instructed to use Nasal Steroid Sprays at any time after your surgery, irrigate with saline BEFORE using the steroid spray, so you do not wash it all out of the nose. °Another product, Nasal Saline Gel (such as AYR Nasal Saline Gel) can be applied in each nostril 3 - 4 times daily to moisture the nose and reduce scabbing or crusting. ° °Bleeding:   Bloody drainage from the nose can be expected for several days, and patients are instructed to irrigate their nose frequently with salt water to help remove mucous and blood clots.  The drainage may be dark red or brown, though some fresh blood may be seen intermittently, especially after irrigation.  Do not blow you nose, as bleeding may occur. If you must sneeze, keep your mouth open to allow air to escape through your mouth. ° °If heavy bleeding occurs: Irrigate the nose with saline to rinse out clots, then spray the nose 3 - 4 times with Afrin Nasal Decongestant Spray.  The spray will constrict the blood vessels to slow bleeding.  Pinch the lower half of your nose shut to apply pressure, and lay down with your head elevated.  Ice packs over the nose may help as well. If bleeding persists despite these measures, you should notify your doctor.  Do not use the Afrin routinely to control nasal congestion after surgery, as it can result in worsening congestion and may affect healing.  ° ° ° °Activity: Return to work varies among patients. Most patients will be   out of work at least 5 - 7 days to recover.  Patient may return to work after they are off of narcotic pain medication, and feeling well enough to perform the functions of their job.  Patients must avoid heavy lifting (over 10 pounds) or strenuous physical for 2 weeks after surgery, so your employer may need to assign you to light duty, or keep you out of work longer if light duty is not possible.  NOTE: you should not drive, operate dangerous machinery, do any mentally demanding tasks or make any important legal or financial decisions while on narcotic pain medication and recovering from the general anesthetic.  °  °Call Your Doctor Immediately if You Have Any of the Following: °1. Bleeding that you cannot control with the above measures °2. Loss of vision, double vision, bulging of the eye or black eyes. °3. Fever over 101 degrees °4. Neck stiffness with  severe headache, fever, nausea and change in mental state. °You are always encourage to call anytime with concerns, however, please call with requests for pain medication refills during office hours. ° °Office Endoscopy: During follow-up visits your doctor will remove any packing or splints that may have been placed and evaluate and clean your sinuses endoscopically.  Topical anesthetic will be used to make this as comfortable as possible, though you may want to take your pain medication prior to the visit.  How often this will need to be done varies from patient to patient.  After complete recovery from the surgery, you may need follow-up endoscopy from time to time, particularly if there is concern of recurrent infection or nasal polyps. ° °General Anesthesia, Adult, Care After °Refer to this sheet in the next few weeks. These instructions provide you with information on caring for yourself after your procedure. Your health care provider may also give you more specific instructions. Your treatment has been planned according to current medical practices, but problems sometimes occur. Call your health care provider if you have any problems or questions after your procedure. °WHAT TO EXPECT AFTER THE PROCEDURE °After the procedure, it is typical to experience: °· Sleepiness. °· Nausea and vomiting. °HOME CARE INSTRUCTIONS °· For the first 24 hours after general anesthesia: °¨ Have a responsible person with you. °¨ Do not drive a car. If you are alone, do not take public transportation. °¨ Do not drink alcohol. °¨ Do not take medicine that has not been prescribed by your health care provider. °¨ Do not sign important papers or make important decisions. °¨ You may resume a normal diet and activities as directed by your health care provider. °· Change bandages (dressings) as directed. °· If you have questions or problems that seem related to general anesthesia, call the hospital and ask for the anesthetist or  anesthesiologist on call. °SEEK MEDICAL CARE IF: °· You have nausea and vomiting that continue the day after anesthesia. °· You develop a rash. °SEEK IMMEDIATE MEDICAL CARE IF:  °· You have difficulty breathing. °· You have chest pain. °· You have any allergic problems. °  °This information is not intended to replace advice given to you by your health care provider. Make sure you discuss any questions you have with your health care provider. °  °Document Released: 10/30/2000 Document Revised: 08/14/2014 Document Reviewed: 11/22/2011 °Elsevier Interactive Patient Education ©2016 [employer]. ° °

## 2015-06-17 ENCOUNTER — Encounter
Admission: RE | Disposition: A | Discharge status: Other Acute Inpatient Hospital | Payer: Self-pay | Source: Ambulatory Visit | Attending: Otolaryngology

## 2015-06-17 ENCOUNTER — Encounter: Payer: Self-pay | Admitting: *Deleted

## 2015-06-17 ENCOUNTER — Ambulatory Visit
Admission: RE | Admit: 2015-06-17 | Discharge: 2015-06-17 | Disposition: A | Discharge status: Other Acute Inpatient Hospital | Payer: Medicare Other | Source: Ambulatory Visit | Attending: Otolaryngology | Admitting: Otolaryngology

## 2015-06-17 ENCOUNTER — Observation Stay
Admission: EM | Admit: 2015-06-17 | Discharge: 2015-06-17 | Disposition: A | Discharge status: Home / Self Care | Payer: Medicare Other | Source: Other Acute Inpatient Hospital | Attending: Otolaryngology | Admitting: Otolaryngology

## 2015-06-17 ENCOUNTER — Ambulatory Visit: Payer: Medicare Other | Admitting: Anesthesiology

## 2015-06-17 ENCOUNTER — Other Ambulatory Visit: Payer: Self-pay | Admitting: Ophthalmology

## 2015-06-17 DIAGNOSIS — I10 Essential (primary) hypertension: Secondary | ICD-10-CM | POA: Insufficient documentation

## 2015-06-17 DIAGNOSIS — M19041 Primary osteoarthritis, right hand: Secondary | ICD-10-CM | POA: Insufficient documentation

## 2015-06-17 DIAGNOSIS — J338 Other polyp of sinus: Secondary | ICD-10-CM | POA: Insufficient documentation

## 2015-06-17 DIAGNOSIS — E7800 Pure hypercholesterolemia, unspecified: Secondary | ICD-10-CM | POA: Diagnosis not present

## 2015-06-17 DIAGNOSIS — H579 Unspecified disorder of eye and adnexa: Secondary | ICD-10-CM | POA: Diagnosis present

## 2015-06-17 DIAGNOSIS — Z79899 Other long term (current) drug therapy: Secondary | ICD-10-CM | POA: Diagnosis not present

## 2015-06-17 DIAGNOSIS — Z9889 Other specified postprocedural states: Secondary | ICD-10-CM | POA: Diagnosis not present

## 2015-06-17 DIAGNOSIS — K219 Gastro-esophageal reflux disease without esophagitis: Secondary | ICD-10-CM | POA: Diagnosis not present

## 2015-06-17 DIAGNOSIS — J328 Other chronic sinusitis: Principal | ICD-10-CM | POA: Insufficient documentation

## 2015-06-17 DIAGNOSIS — M19042 Primary osteoarthritis, left hand: Secondary | ICD-10-CM | POA: Diagnosis not present

## 2015-06-17 DIAGNOSIS — Y838 Other surgical procedures as the cause of abnormal reaction of the patient, or of later complication, without mention of misadventure at the time of the procedure: Secondary | ICD-10-CM | POA: Insufficient documentation

## 2015-06-17 DIAGNOSIS — F17200 Nicotine dependence, unspecified, uncomplicated: Secondary | ICD-10-CM | POA: Insufficient documentation

## 2015-06-17 DIAGNOSIS — H59322 Postprocedural hemorrhage and hematoma of left eye and adnexa following other procedure: Secondary | ICD-10-CM | POA: Insufficient documentation

## 2015-06-17 DIAGNOSIS — M19079 Primary osteoarthritis, unspecified ankle and foot: Secondary | ICD-10-CM | POA: Diagnosis not present

## 2015-06-17 DIAGNOSIS — Z9660 Presence of unspecified orthopedic joint implant: Secondary | ICD-10-CM | POA: Insufficient documentation

## 2015-06-17 DIAGNOSIS — F329 Major depressive disorder, single episode, unspecified: Secondary | ICD-10-CM | POA: Insufficient documentation

## 2015-06-17 DIAGNOSIS — F17210 Nicotine dependence, cigarettes, uncomplicated: Secondary | ICD-10-CM | POA: Diagnosis not present

## 2015-06-17 HISTORY — PX: POLYPECTOMY: SHX149

## 2015-06-17 HISTORY — DX: Unspecified osteoarthritis, unspecified site: M19.90

## 2015-06-17 HISTORY — PX: ETHMOIDECTOMY: SHX5197

## 2015-06-17 HISTORY — PX: FRONTAL SINUS EXPLORATION: SHX6591

## 2015-06-17 HISTORY — DX: Pure hypercholesterolemia, unspecified: E78.00

## 2015-06-17 HISTORY — PX: MAXILLARY ANTROSTOMY: SHX2003

## 2015-06-17 HISTORY — PX: IMAGE GUIDED SINUS SURGERY: SHX6570

## 2015-06-17 SURGERY — SINUS SURGERY, WITH IMAGING GUIDANCE
Anesthesia: General | Wound class: Clean Contaminated

## 2015-06-17 MED ORDER — OXYMETAZOLINE HCL 0.05 % NA SOLN
2.0000 | Freq: Once | NASAL | Status: AC
  Administered 2015-06-17: 2 via NASAL

## 2015-06-17 MED ORDER — KETAMINE HCL 100 MG/ML IJ SOLN
INTRAMUSCULAR | Status: DC | PRN
Start: 2015-06-17 — End: 2015-06-17
  Administered 2015-06-17 (×2): 50 mg via INTRAVENOUS

## 2015-06-17 MED ORDER — OXYCODONE HCL 5 MG/5ML PO SOLN: 5.0000 mg | Freq: Once | ORAL | Status: DC | PRN

## 2015-06-17 MED ORDER — BISACODYL 5 MG PO TBEC: 5.0000 mg | DELAYED_RELEASE_TABLET | Freq: Every day | ORAL | Status: DC | PRN

## 2015-06-17 MED ORDER — QUETIAPINE FUMARATE 100 MG PO TABS: 300.0000 mg | ORAL_TABLET | Freq: Every day | ORAL | Status: DC

## 2015-06-17 MED ORDER — FLEET ENEMA 7-19 GM/118ML RE ENEM: 1.0000 | ENEMA | Freq: Once | RECTAL | Status: DC | PRN

## 2015-06-17 MED ORDER — ACETAMINOPHEN 325 MG PO TABS: 650.0000 mg | ORAL_TABLET | Freq: Four times a day (QID) | ORAL | Status: DC | PRN

## 2015-06-17 MED ORDER — MIDAZOLAM HCL 5 MG/5ML IJ SOLN
INTRAMUSCULAR | Status: DC | PRN
  Administered 2015-06-17: 2 mg via INTRAVENOUS

## 2015-06-17 MED ORDER — SUCCINYLCHOLINE CHLORIDE 20 MG/ML IJ SOLN
INTRAMUSCULAR | Status: DC | PRN
  Administered 2015-06-17: 100 mg via INTRAVENOUS

## 2015-06-17 MED ORDER — ACETAMINOPHEN 650 MG RE SUPP: 650.0000 mg | Freq: Four times a day (QID) | RECTAL | Status: DC | PRN

## 2015-06-17 MED ORDER — MORPHINE SULFATE (PF) 4 MG/ML IV SOLN
3.0000 mg | INTRAVENOUS | Status: DC | PRN
Start: 2015-06-17 — End: 2015-06-17

## 2015-06-17 MED ORDER — LIDOCAINE HCL (CARDIAC) 20 MG/ML IV SOLN
INTRAVENOUS | Status: DC | PRN
  Administered 2015-06-17: 50 mg via INTRAVENOUS

## 2015-06-17 MED ORDER — ONDANSETRON HCL 4 MG/2ML IJ SOLN: 4.0000 mg | Freq: Once | INTRAMUSCULAR | Status: DC | PRN

## 2015-06-17 MED ORDER — MORPHINE SULFATE (PF) 4 MG/ML IV SOLN: 3.0000 mg | INTRAVENOUS | Status: DC | PRN

## 2015-06-17 MED ORDER — HYDROCODONE-ACETAMINOPHEN 5-325 MG PO TABS: 1.0000 | ORAL_TABLET | ORAL | Status: DC | PRN

## 2015-06-17 MED ORDER — ONDANSETRON 4 MG PO TBDP: 4.0000 mg | ORAL_TABLET | Freq: Four times a day (QID) | ORAL | Status: DC | PRN

## 2015-06-17 MED ORDER — FENTANYL CITRATE (PF) 100 MCG/2ML IJ SOLN
INTRAMUSCULAR | Status: DC | PRN
  Administered 2015-06-17: 100 ug via INTRAVENOUS

## 2015-06-17 MED ORDER — ALBUTEROL SULFATE HFA 108 (90 BASE) MCG/ACT IN AERS
INHALATION_SPRAY | RESPIRATORY_TRACT | Status: DC | PRN
  Administered 2015-06-17: 4 via RESPIRATORY_TRACT

## 2015-06-17 MED ORDER — ONDANSETRON HCL 4 MG/2ML IJ SOLN: 4.0000 mg | Freq: Four times a day (QID) | INTRAMUSCULAR | Status: DC | PRN

## 2015-06-17 MED ORDER — HYDROMORPHONE HCL 1 MG/ML IJ SOLN: 0.2500 mg | INTRAMUSCULAR | Status: DC | PRN

## 2015-06-17 MED ORDER — SODIUM CHLORIDE 0.45 % IV SOLN: INTRAVENOUS | Status: AC

## 2015-06-17 MED ORDER — MAGNESIUM HYDROXIDE 400 MG/5ML PO SUSP: 30.0000 mL | Freq: Every day | ORAL | Status: DC | PRN

## 2015-06-17 MED ORDER — PHENYLEPHRINE HCL 0.5 % NA SOLN
NASAL | Status: DC | PRN
  Administered 2015-06-17: 15 mL via TOPICAL

## 2015-06-17 MED ORDER — ALBUTEROL SULFATE HFA 108 (90 BASE) MCG/ACT IN AERS
2.0000 | INHALATION_SPRAY | RESPIRATORY_TRACT | Status: AC
  Administered 2015-06-17: 2 via RESPIRATORY_TRACT

## 2015-06-17 MED ORDER — GLYCOPYRROLATE 0.2 MG/ML IJ SOLN
INTRAMUSCULAR | Status: DC | PRN
  Administered 2015-06-17: 0.1 mg via INTRAVENOUS
  Administered 2015-06-17: 0.4 mg via INTRAVENOUS

## 2015-06-17 MED ORDER — SODIUM CHLORIDE 0.45 % IV SOLN: INTRAVENOUS | Status: DC

## 2015-06-17 MED ORDER — OXYCODONE HCL 5 MG PO TABS: 5.0000 mg | ORAL_TABLET | Freq: Once | ORAL | Status: DC | PRN

## 2015-06-17 MED ORDER — DEXAMETHASONE SODIUM PHOSPHATE 4 MG/ML IJ SOLN
INTRAMUSCULAR | Status: DC | PRN
  Administered 2015-06-17: 10 mg via INTRAVENOUS

## 2015-06-17 MED ORDER — BACITRACIN-POLYMYXIN B 500-10000 UNIT/GM OP OINT: 1.0000 "application " | TOPICAL_OINTMENT | OPHTHALMIC | Status: DC

## 2015-06-17 MED ORDER — ACETAMINOPHEN 10 MG/ML IV SOLN
1000.0000 mg | Freq: Once | INTRAVENOUS | Status: AC
  Administered 2015-06-17: 1000 mg via INTRAVENOUS

## 2015-06-17 MED ORDER — PROPOFOL 10 MG/ML IV BOLUS
INTRAVENOUS | Status: DC | PRN
  Administered 2015-06-17: 200 mg via INTRAVENOUS

## 2015-06-17 MED ORDER — LACTATED RINGERS IV SOLN
INTRAVENOUS | Status: DC
  Administered 2015-06-17: 10 mL/h via INTRAVENOUS

## 2015-06-17 MED ORDER — ROCURONIUM BROMIDE 100 MG/10ML IV SOLN
INTRAVENOUS | Status: DC | PRN
  Administered 2015-06-17: 5 mg via INTRAVENOUS
  Administered 2015-06-17: 10 mg via INTRAVENOUS
  Administered 2015-06-17: 30 mg via INTRAVENOUS

## 2015-06-17 MED ORDER — FLEET ENEMA 7-19 GM/118ML RE ENEM
1.0000 | ENEMA | Freq: Once | RECTAL | Status: DC | PRN
Start: 2015-06-17 — End: 2015-06-17

## 2015-06-17 MED ORDER — CEFAZOLIN SODIUM-DEXTROSE 2-3 GM-% IV SOLR
2.0000 g | Freq: Once | INTRAVENOUS | Status: AC
  Administered 2015-06-17: 2 g via INTRAVENOUS

## 2015-06-17 MED ORDER — ONDANSETRON HCL 4 MG/2ML IJ SOLN
INTRAMUSCULAR | Status: DC | PRN
  Administered 2015-06-17: 4 mg via INTRAVENOUS

## 2015-06-17 MED ORDER — LIDOCAINE-EPINEPHRINE 1 %-1:100000 IJ SOLN
INTRAMUSCULAR | Status: DC | PRN
  Administered 2015-06-17: 2.5 mL

## 2015-06-17 MED ORDER — NEOSTIGMINE METHYLSULFATE 10 MG/10ML IV SOLN
INTRAVENOUS | Status: DC | PRN
  Administered 2015-06-17: 3 mg via INTRAVENOUS

## 2015-06-17 SURGICAL SUPPLY — 32 items
BALLOON SINUPLASTY SYSTEM (BALLOONS) IMPLANT
BATTERY INSTRU NAVIGATION (MISCELLANEOUS) ×16 IMPLANT
BLADE IRRIGATOR 40D CVD (IRRIGATION / IRRIGATOR) ×4 IMPLANT
CANISTER SUCT 1200ML W/VALVE (MISCELLANEOUS) ×4 IMPLANT
CATH IV 18X1 1/4 SAFELET (CATHETERS) ×4 IMPLANT
COAGULATOR SUCT 8FR VV (MISCELLANEOUS) IMPLANT
DEVICE INFLATION SEID (MISCELLANEOUS) IMPLANT
DRAPE HEAD BAR (DRAPES) ×4 IMPLANT
DRESSING NASL FOAM PST OP SINU (MISCELLANEOUS) IMPLANT
DRSG NASAL 4CM NASOPORE (MISCELLANEOUS) IMPLANT
DRSG NASAL FOAM POST OP SINU (MISCELLANEOUS)
GLOVE PI ULTRA LF STRL 7.5 (GLOVE) ×4 IMPLANT
GLOVE PI ULTRA NON LATEX 7.5 (GLOVE) ×4
IRRIGATOR 4MM STR (IRRIGATION / IRRIGATOR) ×4 IMPLANT
IV CATH 18X1 1/4 SAFELET (CATHETERS) ×2
IV NS 500ML (IV SOLUTION) ×2
IV NS 500ML BAXH (IV SOLUTION) ×2 IMPLANT
KIT ROOM TURNOVER OR (KITS) ×4 IMPLANT
NAVIGATION MASK REG  ST (MISCELLANEOUS) ×4 IMPLANT
NEEDLE HYPO 25GX1X1/2 BEV (NEEDLE) ×4 IMPLANT
NEEDLE SPNL 25GX3.5 QUINCKE BL (NEEDLE) IMPLANT
NS IRRIG 500ML POUR BTL (IV SOLUTION) ×4 IMPLANT
PACK DRAPE NASAL/ENT (PACKS) ×4 IMPLANT
PACKING NASAL EPIS 4X2.4 XEROG (MISCELLANEOUS) ×4 IMPLANT
PAD GROUND ADULT SPLIT (MISCELLANEOUS) ×4 IMPLANT
PATTIES SURGICAL .5 X3 (DISPOSABLE) ×4 IMPLANT
SOL ANTI-FOG 6CC FOG-OUT (MISCELLANEOUS) ×2 IMPLANT
SOL FOG-OUT ANTI-FOG 6CC (MISCELLANEOUS) ×2
STRAP BODY AND KNEE 60X3 (MISCELLANEOUS) ×4 IMPLANT
SYR 3ML LL SCALE MARK (SYRINGE) ×4 IMPLANT
TOWEL OR 17X26 4PK STRL BLUE (TOWEL DISPOSABLE) ×4 IMPLANT
WATER STERILE IRR 500ML POUR (IV SOLUTION) ×4 IMPLANT

## 2015-06-17 NOTE — Consult Note (Signed)
  Reason for Consult:orbital hemorrhage Referring Physician: Kathe MarinerJuengel  Richard Davis is an 39 y.o. male.  Chief complaint: Left eye hemorrhage following sinus surgery  <principal problem not specified>  HPI: Patient under went ethmoidectomy today and experienced intraop orbital hemorrhage.  Lateral canthotomy and cantholysis were performed.  Patient was able to see well in PACU. Pt denies diplopia or blurred vision.  No pain around OS.  Denies prior eye probs - but does say that people notice his eye wanders sometimes.  No Nausea/ vom.  Past Medical History  Diagnosis Date  . Hypertension   . GERD (gastroesophageal reflux disease)   . Depression   . Arthritis     hands, foot, arm  . Hypercholesteremia     ROS  Past Surgical History  Procedure Laterality Date  . Tonsillectomy    . Joint replacement    . Cardiac catheterization N/A 01/27/2015    Procedure: Left Heart Cath;  Surgeon: Marcina MillardAlexander Paraschos, MD;  [facility];  Service: Cardiovascular;  Laterality: N/A;    History reviewed. No pertinent family history.  Social History:  reports that he has been smoking Cigarettes.  He has a 30 pack-year smoking history. He uses smokeless tobacco. He reports that he does not drink alcohol or use illicit drugs.  Allergies: No Known Allergies  Medications: I have reviewed the patient's current medications.  No results found for this or any previous visit (from the past 48 hour(s)).  No results found.  There were no vitals taken for this visit.  Mental status: Alert and Oriented x 4  Visual Acuity:  approx 20/40 OD  20/40 dist [place]  Pupils:  Equally round/ reactive to light.  No Afferent defect.  Motility:  Exophoria / full.  (denies diplopia)  Visual Fields:  Full to confrontation OU  IOP:  14/14 c tonopen  External/ Lids/ Lashes:  eccymosis LUL and medial canthus - Lateral canthotomy open  Anterior Segment:  Conjunctiva:  Normal  OU  No subconj  heme  Cornea:  Normal  OU  Anterior Chamber: Normal  OU  Lens:   Normal OU  Posterior Segment:undilated  Discs:   Normal c/d ratio 0.4 OU , no pallor, no edema OU  Macula:  Normal  Vessels/ Periphery: Good perfusion OU    Assessment/Plan: Orbital hemorrhage OS - vision/ pressure normal.  Motility not disturbed by hemorrhage.   Recommend bacitracin ung to lid q4 hrs and close canthotomy tomorrow.  Juvenal Umar 06/17/2015, 5:52 PM

## 2015-06-17 NOTE — Op Note (Signed)
06/17/2015  1:41 PM    Richard Davis, Richard Davis  [id number]   Pre-Op Dx:  Chronic sinusitis involving bilateral frontal sinuses, ethmoid sinuses, and left maxillary sinus, bilateral nasal polyps  Post-op Dx: Same  Proc: Left endoscopic intranasal polypectomy, left endoscopic maxillary antrostomy with removal of contents, left endoscopic ethmoidectomy revision and removal of polyps, left endoscopic frontal sinusotomy with removal of polyps, left lateral canthotomy   Surg:  Fatima Fedie Davis  Anes:  GOT  EBL:  250 mL  Comp:  Left intraorbital bleed with proptosis requiring left lateral canthotomy and removal of lamina papyracea to release pressure from the medial orbit  Findings:  Patient had large polyps filling most of the upper airway with polypoid changes throughout the ethmoid and frontal sinus ducts. The maxillary antrum was filled up with polyps and cleaned as well. While finishing cleaning the polyps, the posterior ethmoid artery was injured which led to left intraorbital bleeding. A left lateral canthotomy was done and removal of the medial wall of the orbit to release pressure. The procedure was stopped and patient awakened. His vision appears to be relatively normal and he has extraocular movements although limited because of some swelling.  Procedure: The patient was brought to the operating room and placed in the supine position. He was given general anesthesia by oral endotracheal intubation. His nose was prepped using 3 mL of 1% Xylocaine with epi 1:100,000 for infiltration of the middle turbinate polyp area on both sides. Cottonoid pledgets were placed on both sides the nose that were soaked in phenylephrine and Xylocaine. The image guided system was brought in and the CT scan was downloaded from the disc. The template was applied to the face and was registered to the system. There was 0.8 mm of variance. The suction instruments were then registered the system and showed good alignment  with the system. He was prepped and draped in a sterile fashion  The 0 scope was used to visualize the left nasal passage first. The middle turbinate was 1 large polyp that was filling most of the upper airway and I could not get around it. The image guided system was used to visualize this and look for landmarks. This was filling up most of the maxillary antrum along with polyps in this area. The Diego microdebrider was used for trimming much the middle turbinate. 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 Polypoid disease inside of that were opened and cleaned and the entire middle turbinate was trimmed to allow visualization of the middle meatus. The ethmoid sinus been partially opened and showed evidence of polypoid disease throughout. The maxillary antrum was widened and make sure the natural ostium was opened. The Diego microdebrider was used to clean around this and widen the ostia more posteriorly and superiorly. 30 scope was used to visualize this area and make sure it was clear.  The 0 scope and microdebrider were used around the posterior ethmoid area to open up all of the posterior ethmoid sinuses and remove all the polyps. The 30 scope was used to open up the middle ethmoid air cells and then finally way into the frontal sinus through the polyps. Most the polyps were removed there were still some of the posterior wall. As I removed the remaining polyps or the posterior wall there was active bleeding that was encountered. This appeared to be from the posterior ethmoid artery. I checked the eyes immediately and could feel a little bit of pressure change on the left side I watch the area closely  in the ethmoid closely and cauterize some of this. The eye was getting a little more tense so felt a lateral canthotomy was the most prudent course to release pressure from the eye. This was done with sharp scissors through the lower lateral canthus. Cottonoid pledgets were placed in nose temporarily while this  was done and there is no further bleeding noted of inside the nose at the level of the posterior ethmoid. The there is still firmness around the orbit and I felt that removal of the lamina papyracea to allow further relaxation around the orbit would be best for the patient. Using a freer elevator and the bone was slowly picked away from the lamina papyracea. The periosteum be seen and there was a purplish hue to it from some old blood inside the orbital cavity. A sickle knife was used to incise the periosteum along where the lamina papyracea was removed this allowed orbital fat to herniate into the lateral ethmoid sinus release more pressure from the orbit. His pupil was the same as the opposite side and the tenseness around the eye had the settled down.  Is no further bleeding in the ethmoid at this point and I placed xerogel into the ethmoid sinus to cover over the orbital fat and filled most of the ethmoid. This covered over some of the middle turbinate remnant. The right side did not have any surgery done on it and was not addressed at all. It was felt prudent to stop the procedure at this time and observe the patient to make sure the left eye was okay.  Patient was awakened and taken to the recovery room. As he awakened the eyes were checked. He could count fingers from his left eye. He had limited mobility with extraocular movements but could move in all directions.  Dispo:   From PACU to be transferred to [facility] to be observed overnight.  Plan:  I spoken with Dr. Inez Pilgrim (ophthalmology) who will see the patient this afternoon/evening to check his eye as well. I will see him again this evening after office hours to make sure he is doing well and see if he possibly can be discharged home in the morning.  Richard Davis  06/17/2015 1:41 PM

## 2015-06-17 NOTE — Progress Notes (Signed)
Dr.Juengel arrived to floor to see pt and d/c.  Unable to complete d/c because of incomplete transfer reconsiliation when trying to print d/c papers. Called everyone in the hospital and awaiting call from [place] link. Dr.Juengel ok pt to leave because he needed to get to pharmacy before 10pm to fill pain meds.  Pt was wheeled downstairs in stable condition. Dawn the nurse is aware as is Scientist, research (medical)DrJuengal.

## 2015-06-17 NOTE — H&P (Signed)
  H&P has been reviewed and no changes necessary. To be downloaded later. 

## 2015-06-17 NOTE — Progress Notes (Signed)
Pt alert and oriented.  Notified MD about orders to be placed and pt request for food, received order for regular diet. MD to place additional orders and RN may continue previously prescribed medication regimen as needed on 06/17/15 until MD arrives on unit and places new orders.

## 2015-06-17 NOTE — Discharge Summary (Signed)
06/17/2015 7:49 PM  Richard Davis, Richard Davis [id number]  Post-Op Day : 6 hours postop    Temp:  [98.2 F (36.8 C)-99.5 F (37.5 C)] 98.6 F (37 C) (11/10 1429) Pulse Rate:  [99-109] 104 (11/10 1427) Resp:  [15-22] 18 (11/10 1429) BP: (128-147)/(82-94) 147/94 mmHg (11/10 1429) SpO2:  [90 %-95 %] 91 % (11/10 1429) Weight:  [116.5 kg (256 lb 13.4 oz)-116.574 kg (257 lb)] 116.5 kg (256 lb 13.4 oz) (11/10 1940),     Intake/Output Summary (Last 24 hours) at 06/17/15 1949 Last data filed at 06/17/15 1302  Gross per 24 hour  Intake   1000 ml  Output    250 ml  Net    750 ml    No results found for this or any previous visit (from the past 24 hour(s)).  SUBJECTIVE:  Patient is feeling much better and he can see out of his eye well. He is having minimal discomfort. He has good air flow through his nose and is not bleeding from his nose. He has been seen by Dr. Inez PilgrimBrasington who says his vision is good and his extraocular movements are fairly normal.  OBJECTIVE:  He has bruising around his eyelid but almost all the swelling is gone and there is minimal proptosis at this time. His good opening in his nose and is breathing well through his nose without bleeding.  IMPRESSION:  He has had intraorbital hemorrhage during surgery for his sinuses. His swelling is come down and the risk of injury to his vision is now past. Dr. Inez PilgrimBrasington will see him tomorrow to repair the lateral canthotomy. I will discharge him home tonight to allow him to rest at home. The rest of the weekend. We'll plan to see him on Wednesday to reevaluate his sinuses to make sure they're healing well. Only his left sinuses were operated on and I stopped once he had the intracranial bleed and did not proceed with the right sinuses. A shot understands this and about the complication and has no further questions  PLAN:  Discharge home this evening to rest at home.  Keiley Levey H 06/17/2015, 7:49 PM

## 2015-06-17 NOTE — Progress Notes (Signed)
[place] link advisor called and told me to copy and paste d/c orders to put into chart. They are aware of the situation.

## 2015-06-17 NOTE — Anesthesia Procedure Notes (Signed)
Procedure Name: Intubation Date/Time: 06/17/2015 11:43 AM Performed by: Jimmy PicketAMYOT, Dayvian Pre-anesthesia Checklist: Patient identified, Emergency Drugs available, Suction available, Patient being monitored and Timeout performed Patient Re-evaluated:Patient Re-evaluated prior to inductionOxygen Delivery Method: Circle system utilized Preoxygenation: Pre-oxygenation with 100% oxygen Intubation Type: IV induction Ventilation: Mask ventilation without difficulty Laryngoscope Size: Glidescope Grade View: Grade I Tube type: Oral Rae Tube size: 7.5 mm Number of attempts: 1 Airway Equipment and Method: Stylet Placement Confirmation: ETT inserted through vocal cords under direct vision,  positive ETCO2 and breath sounds checked- equal and bilateral Tube secured with: Tape Dental Injury: Teeth and Oropharynx as per pre-operative assessment

## 2015-06-17 NOTE — Anesthesia Postprocedure Evaluation (Signed)
  Anesthesia Post-op Note  Patient: Richard CoverMichael B Davis  Procedure(s) Performed: Procedure(s) with comments: IMAGE GUIDED SINUS SURGERY (N/A) - GAVE DISK TO [name] PROPEL Gave 2nd disk to cece 10-24 kp ENDOSCOPIC MAXILLARY ANTROSTOMY WITH REMOVAL CONTENTS (Left) ETHMOIDECTOMY REVISION (Bilateral) FRONTAL SINUS EXPLORATION (Bilateral) POLYPECTOMY NASAL (Bilateral)      Anesthesia type:General  Patient location: PACU  Post pain: Pain level controlled  Post assessment: Post-op Vital signs reviewed, Patient's Cardiovascular Status Stable, Respiratory Function Stable, Patent Airway and No signs of Nausea or vomiting  Post vital signs: Reviewed and stable  Last Vitals:  Filed Vitals:   06/17/15 1325  BP: 128/87  Pulse: 109  Temp: 36.8 C  Resp: 15    Level of consciousness: awake, alert  and patient cooperative  Complications: No apparent anesthesia complications, Pt with L peri-orbital ecchymosis unchanged from intra-operative. Pt VSS, full report given to EMS. Dr. Elenore RotaJuengel has spoken with nurse manager and Pt [facility] de [facility].

## 2015-06-17 NOTE — Transfer of Care (Signed)
Immediate Anesthesia Transfer of Care Note  Patient: Richard Davis  Procedure(s) Performed: Procedure(s) with comments: IMAGE GUIDED SINUS SURGERY (N/A) - GAVE DISK TO [name] PROPEL Gave 2nd disk to cece 10-24 kp ENDOSCOPIC MAXILLARY ANTROSTOMY WITH REMOVAL CONTENTS (Left) ETHMOIDECTOMY REVISION (Bilateral) FRONTAL SINUS EXPLORATION (Bilateral) POLYPECTOMY NASAL (Bilateral)  Patient Location: PACU  Anesthesia Type: General  Level of Consciousness: awake, alert  and patient cooperative  Airway and Oxygen Therapy: Patient Spontanous Breathing and Patient connected to supplemental oxygen  Post-op Assessment: Post-op Vital signs reviewed, Patient's Cardiovascular Status Stable, Respiratory Function Stable, Patent Airway and No signs of Nausea or vomiting. Pt L eye remains swollen, Dr. Marcene BrawnKovacs aware of pt status.   Post-op Vital Signs: Reviewed and stable  Complications: No apparent anesthesia complications

## 2015-06-17 NOTE — Anesthesia Preprocedure Evaluation (Addendum)
Anesthesia Evaluation  Patient identified by MRN, date of birth, ID band Patient awake    Reviewed: Allergy & Precautions, H&P , NPO status , Patient's Chart, lab work & pertinent test results, reviewed documented beta blocker date and time   Airway Mallampati: IV  TM Distance: >3 FB Neck ROM: full  Mouth opening: Limited Mouth Opening  Dental no notable dental hx.    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   GI/Hepatic Neg liver ROS, GERD  Medicated,  Endo/Other  negative endocrine ROS  Renal/GU negative Renal ROS  negative genitourinary   Musculoskeletal   Abdominal   Peds  Hematology negative hematology ROS (+)   Anesthesia Other Findings   Reproductive/Obstetrics negative OB ROS                          Anesthesia Physical Anesthesia Plan  ASA: III  Anesthesia Plan: General   Post-op Pain Management:    Induction:   Airway Management Planned:   Additional Equipment:   Intra-op Plan:   Post-operative Plan:   Informed Consent: I have reviewed the patients History and Physical, chart, labs and discussed the procedure including the risks, benefits and alternatives for the proposed anesthesia with the patient or authorized representative who has indicated his/her understanding and acceptance.     Plan Discussed with: CRNA  Anesthesia Plan Comments:         Anesthesia Quick Evaluation

## 2015-06-18 ENCOUNTER — Encounter: Payer: Self-pay | Admitting: Otolaryngology

## 2015-06-18 NOTE — OR Nursing (Signed)
Dr. Elenore RotaJuengel also performed a lateral left canthotomy.  I was unable to list this in the procedure area  for this patients record.

## 2015-06-21 LAB — SURGICAL PATHOLOGY

## 2015-06-21 NOTE — H&P (Signed)
Richard Davis, [name] 08/11/1975 No att. providers found  Reason for Admission: post ethmoidectomy with left intraorbital hematoma   HPI: The patient [place] [facility] [place] polyps in his sinuses. This was revision surgery with chronic sinusitis. A lots of sclerotic bone and polyps throughout his sinuses. During the procedure on the left side the posterior ethmoid artery was encountered and started to bleed. This lead to hemorrhage into the left orbit. A lateral canthotomy done to release pressure and a medial orbital decompression into the ethmoid sinus. This released the pressure on the eye. He is permitted for observation and checking of his eye.  Allergies: No Known Allergies  ROS: Review of systems normal other than 12 systems except per HPI.  PMH:  Past Medical History  Diagnosis Date  . Hypertension   . GERD (gastroesophageal reflux disease)   . Depression   . Arthritis     hands, foot, arm  . Hypercholesteremia     FH: History reviewed. No pertinent family history.  SH:  Social History   Social History  . Marital Status: Single    Spouse Name: N/A  . Number of Children: N/A  . Years of Education: N/A   Occupational History  . Not on file.   Social History Main Topics  . Smoking status: Current Every Day Smoker -- 2.00 packs/day for 15 years    Types: Cigarettes  . Smokeless tobacco: Current User  . Alcohol Use: No  . Drug Use: No  . Sexual Activity: Not on file   Other Topics Concern  . Not on file   Social History Narrative    PSH:  Past Surgical History  Procedure Laterality Date  . Tonsillectomy    . Joint replacement    . Cardiac catheterization N/A 01/27/2015    Procedure: Left Heart Cath;  Surgeon: Marcina MillardAlexander Paraschos, MD;  [facility];  Service: Cardiovascular;  Laterality: N/A;  . Image guided sinus surgery N/A 06/17/2015    Procedure: IMAGE GUIDED SINUS SURGERY;  Surgeon:  Vernie MurdersPaul Attilio Zeitler, MD;  [facility];  Service: ENT;  Laterality: N/A;  GAVE DISK TO [name] PROPEL Gave 2nd disk to cece 10-24 kp  . Maxillary antrostomy Left 06/17/2015    Procedure: ENDOSCOPIC MAXILLARY ANTROSTOMY WITH REMOVAL CONTENTS;  Surgeon: Vernie MurdersPaul Giovanny Dugal, MD;  [facility];  Service: ENT;  Laterality: Left;  . Ethmoidectomy Left 06/17/2015    Procedure: ETHMOIDECTOMY REVISION and removal of polyps.;  Surgeon: Vernie MurdersPaul Escher Harr, MD;  [facility];  Service: ENT;  Laterality: Left;  . Frontal sinus exploration Left 06/17/2015    Procedure: FRONTAL Sinusotomy with removal of polyps.;  Surgeon: Vernie MurdersPaul Moxie Kalil, MD;  [facility];  Service: ENT;  Laterality: Left;  . Polypectomy Left 06/17/2015    Procedure: POLYPECTOMY NASAL;  Surgeon: Vernie MurdersPaul Ulric Salzman, MD;  [facility];  Service: ENT;  Laterality: Left;    Physical  Exam: Well-developed well-nourished white male. his left orbit has swelling and bruising of the upper and lower lids but most of the proptosis is now gone.CN 2-12 grossly intact and symmetric. EAC/TMs normal BL. Oral cavity, lips, gums, ororpharynx normal with no masses or lesions. Skin warm and dry. Nasal cavity without polyps or purulence. External nose and ears without masses or lesions. EOMI, PERRLA. Neck supple with no masses or lesions. No lymphadenopathy palpated. Thyroid normal with no masses.   A/P:    Chronic sinusitis  nasal polyps with now left periorbital hematoma. His vision is clear and he extracted movements are intact. Swelling is settling down. I've asked Dr. Inez Pilgrim to evaluate his eye. We will watch overnight and plan to discharge him home in the morning.   [name] 06/21/2015 6:26 PM

## 2015-08-29 ENCOUNTER — Emergency Department: Payer: Medicare Other

## 2015-08-29 ENCOUNTER — Emergency Department
Admission: EM | Admit: 2015-08-29 | Discharge: 2015-08-29 | Disposition: A | Discharge status: Home / Self Care | Payer: Medicare Other | Attending: Student | Admitting: Student

## 2015-08-29 DIAGNOSIS — Z7982 Long term (current) use of aspirin: Secondary | ICD-10-CM | POA: Insufficient documentation

## 2015-08-29 DIAGNOSIS — I10 Essential (primary) hypertension: Secondary | ICD-10-CM | POA: Diagnosis not present

## 2015-08-29 DIAGNOSIS — Z79899 Other long term (current) drug therapy: Secondary | ICD-10-CM | POA: Insufficient documentation

## 2015-08-29 DIAGNOSIS — Z7951 Long term (current) use of inhaled steroids: Secondary | ICD-10-CM | POA: Insufficient documentation

## 2015-08-29 DIAGNOSIS — W1839XA Other fall on same level, initial encounter: Secondary | ICD-10-CM | POA: Diagnosis not present

## 2015-08-29 DIAGNOSIS — Y9389 Activity, other specified: Secondary | ICD-10-CM | POA: Insufficient documentation

## 2015-08-29 DIAGNOSIS — Z792 Long term (current) use of antibiotics: Secondary | ICD-10-CM | POA: Diagnosis not present

## 2015-08-29 DIAGNOSIS — S8261XA Displaced fracture of lateral malleolus of right fibula, initial encounter for closed fracture: Secondary | ICD-10-CM | POA: Insufficient documentation

## 2015-08-29 DIAGNOSIS — F17210 Nicotine dependence, cigarettes, uncomplicated: Secondary | ICD-10-CM | POA: Diagnosis not present

## 2015-08-29 DIAGNOSIS — Y998 Other external cause status: Secondary | ICD-10-CM | POA: Insufficient documentation

## 2015-08-29 DIAGNOSIS — S99911A Unspecified injury of right ankle, initial encounter: Secondary | ICD-10-CM | POA: Diagnosis present

## 2015-08-29 DIAGNOSIS — S82401A Unspecified fracture of shaft of right fibula, initial encounter for closed fracture: Secondary | ICD-10-CM

## 2015-08-29 DIAGNOSIS — Y92002 Bathroom of unspecified non-institutional (private) residence single-family (private) house as the place of occurrence of the external cause: Secondary | ICD-10-CM | POA: Insufficient documentation

## 2015-08-29 MED ORDER — MELOXICAM 15 MG PO TABS: 15.0000 mg | ORAL_TABLET | Freq: Every day | ORAL | Status: DC

## 2015-08-29 MED ORDER — OXYCODONE HCL 5 MG PO TABS
10.0000 mg | ORAL_TABLET | Freq: Once | ORAL | Status: AC
  Administered 2015-08-29: 10 mg via ORAL
  Filled 2015-08-29: qty 2

## 2015-08-29 NOTE — Discharge Instructions (Signed)
Fibular Ankle Fracture Treated With or Without Immobilization, Adult °A fibular fracture at your ankle is a break (fracture) bone in the smallest of the two bones in your lower leg, located on the outside of your leg (fibula) close to the area at your ankle joint. °CAUSES °· Rolling your ankle. °· Twisting your ankle. °· Extreme flexing or extending of your foot. °· Severe force on your ankle as when falling from a distance. °RISK FACTORS °· Jumping activities. °· Participation in sports. °· Osteoporosis. °· Advanced age. °· Previous ankle injuries. °SIGNS AND SYMPTOMS °· Pain. °· Swelling. °· Inability to put weight on injured ankle. °· Bruising. °· Bone deformities at site of injury. °DIAGNOSIS  °This fracture is diagnosed with the help of an X-ray exam. °TREATMENT  °If the fractured bone did not move out of place it usually will heal without problems and does casting or splinting. If immobilization is needed for comfort or the fractured bone moved out of place and will not heal properly with immobilization, a cast or splint will be used. °HOME CARE INSTRUCTIONS  °· Apply ice to the area of injury: °¨ Put ice in a plastic bag. °¨ Place a towel between your skin and the bag. °¨ Leave the ice on for 20 minutes, 2-3 times a day. °· Use crutches as directed. Resume walking without crutches as directed by your health care provider. °· Only take over-the-counter or prescription medicines for pain, discomfort, or fever as directed by your health care provider. °· If you have a removable splint or boot, do not remove the boot unless directed by your health care provider. °SEEK MEDICAL CARE IF:  °· You have continued pain or more swelling °· The medications do not control the pain. °SEEK IMMEDIATE MEDICAL CARE IF: °· You develop severe pain in the leg or foot. °· Your skin or nails below the injury turn blue or grey or feel cold or numb. °MAKE SURE YOU:  °· Understand these instructions. °· Will watch your  condition. °· Will get help right away if you are not doing well or get worse. °  °This information is not intended to replace advice given to you by your health care provider. Make sure you discuss any questions you have with your health care provider. °  °Document Released: 07/24/2005 Document Revised: 08/14/2014 Document Reviewed: 03/05/2013 °Elsevier Interactive Patient Education ©2016 [employer] ° °

## 2015-08-29 NOTE — ED Provider Notes (Signed)
[facility] Emergency Department Provider Note  ____________________________________________  Time seen: Approximately 12:22 PM  I have reviewed the triage vital signs and the nursing notes.   HISTORY  Chief Complaint Ankle Pain  HPI ESDRAS DELAIR is a 40 y.o. male who presents to the emergency department for evaluation of right ankle and foot pain. He had sat on the toilet and his right foot and ankle "fell asleep." When he stood up his ankle turned "underneath him." He has had pain and swelling since that time and is now unable to stand to bear weight due to pain. He denies previous injury to this ankle.   Past Medical History  Diagnosis Date  . Hypertension   . GERD (gastroesophageal reflux disease)   . Depression   . Arthritis     hands, foot, arm  . Hypercholesteremia     Patient Active Problem List   Diagnosis Date Noted  . Surgical complication involving left eye associated with non-ophthalmic procedure 06/17/2015  . Chest pain with high risk for cardiac etiology 01/27/2015  . Abnormal nuclear stress test 01/27/2015  . Chronic pain 01/27/2015  . Acid reflux 01/27/2015  . HLD (hyperlipidemia) 01/27/2015  . BP (high blood pressure) 01/27/2015  . Ache in joint 01/27/2015  . Sinus infection 01/27/2015  . Current tobacco use 01/27/2015  . Breathlessness on exertion 12/31/2014  . Breast development in males 02/10/2013  . Schizo-affective psychosis (HCC) 06/04/2012    Past Surgical History  Procedure Laterality Date  . Tonsillectomy    . Joint replacement    . Cardiac catheterization N/A 01/27/2015    Procedure: Left Heart Cath;  Surgeon: Marcina Millard, MD;  Location: [facility] INVASIVE CV LAB;  Service: Cardiovascular;  Laterality: N/A;  . Image guided sinus surgery N/A 06/17/2015    Procedure: IMAGE GUIDED SINUS SURGERY;  Surgeon: Vernie Murders, MD;  Location: [facility] SURGERY CNTR;  Service: ENT;  Laterality: N/A;  GAVE DISK TO  [name] PROPEL Gave 2nd disk to cece 10-24 kp  . Maxillary antrostomy Left 06/17/2015    Procedure: ENDOSCOPIC MAXILLARY ANTROSTOMY WITH REMOVAL CONTENTS;  Surgeon: Vernie Murders, MD;  Location: [facility] SURGERY CNTR;  Service: ENT;  Laterality: Left;  . Ethmoidectomy Left 06/17/2015    Procedure: ETHMOIDECTOMY REVISION and removal of polyps.;  Surgeon: Vernie Murders, MD;  Location: [facility] SURGERY CNTR;  Service: ENT;  Laterality: Left;  . Frontal sinus exploration Left 06/17/2015    Procedure: FRONTAL Sinusotomy with removal of polyps.;  Surgeon: Vernie Murders, MD;  Location: [facility] SURGERY [facility]TR;  Service: ENT;  Laterality: Left;  . Polypectomy Left 06/17/2015    Procedure: POLYPECTOMY NASAL;  Surgeon: Vernie Murders, MD;  Location: [facility] SURGERY CNTR;  Service: ENT;  Laterality: Left;    Current Outpatient Rx  Name  Route  Sig  Dispense  Refill  . albuterol (PROVENTIL HFA;VENTOLIN HFA) 108 (90 BASE) MCG/ACT inhaler   Inhalation   Inhale 2 puffs into the lungs every 6 (six) hours as needed for wheezing or shortness of breath.         Marland Kitchen amitriptyline (ELAVIL) 100 MG tablet   Oral   Take 100 mg by mouth at bedtime.         Marland Kitchen amoxicillin-clavulanate (AUGMENTIN) 875-125 MG tablet   Oral   Take 1 tablet by mouth 2 (two) times daily.         Marland Kitchen aspirin EC 81 MG tablet   Oral   Take 81 mg by mouth daily.         Marland Kitchen  bacitracin-polymyxin b (POLYSPORIN) ophthalmic ointment   Left Eye   Place 1 application into the left eye every 4 (four) hours while awake. apply to left eyelid every 4 hours while awake   3.5 g   0   . cabergoline (DOSTINEX) 0.5 MG tablet   Oral   Take 0.25 mg by mouth 2 (two) times a week.         . doxycycline (VIBRAMYCIN) 100 MG capsule   Oral   Take 1 capsule (100 mg total) by mouth 2 (two) times daily.   20 capsule   0   . fluPHENAZine (PROLIXIN) 5 MG tablet   Oral   Take 5 mg by mouth daily.         . fluticasone (FLONASE) 50 MCG/ACT nasal spray    Each Nare   Place 2 sprays into both nostrils daily.         Marland Kitchen gabapentin (NEURONTIN) 300 MG capsule   Oral   Take 300 mg by mouth 3 (three) times daily.         . haloperidol (HALDOL) 10 MG tablet   Oral   Take 10 mg by mouth at bedtime.         . isosorbide mononitrate (IMDUR) 30 MG 24 hr tablet   Oral   Take 30 mg by mouth daily.         Marland Kitchen ketoconazole (NIZORAL) 2 % cream   Topical   Apply 1 application topically 2 (two) times daily.   30 g   0   . lisinopril-hydrochlorothiazide (PRINZIDE,ZESTORETIC) 20-12.5 MG tablet   Oral   Take 1 tablet by mouth daily.         . meloxicam (MOBIC) 15 MG tablet   Oral   Take 1 tablet (15 mg total) by mouth daily.   30 tablet   0   . METHADONE HCL PO   Oral   Take 165 mg by mouth daily.         . methazolamide (NEPTAZANE) 50 MG tablet   Oral   Take 175 mg by mouth daily.         Marland Kitchen Phenylephrine-APAP-Guaifenesin (MUCINEX FAST-MAX COLD & SINUS) 5-325-200 MG TABS   Oral   Take by mouth as needed.         Marland Kitchen QUEtiapine (SEROQUEL) 300 MG tablet   Oral   Take 300 mg by mouth at bedtime.         . simvastatin (ZOCOR) 40 MG tablet   Oral   Take 40 mg by mouth daily.         . traZODone (DESYREL) 100 MG tablet   Oral   Take 100 mg by mouth at bedtime.           Allergies Review of patient's allergies indicates no known allergies.  No family history on file.  Social History Social History  Substance Use Topics  . Smoking status: Current Every Day Smoker -- 2.00 packs/day for 15 years    Types: Cigarettes  . Smokeless tobacco: Current User  . Alcohol Use: No    Review of Systems Constitutional: No recent illness. Eyes: No visual changes. ENT: No sore throat. Cardiovascular: Denies chest pain or palpitations. Respiratory: Denies shortness of breath. Gastrointestinal: No abdominal pain.  Genitourinary: Negative for dysuria. Musculoskeletal: Pain in right ankle and foot.  Skin: Negative for  rash. Neurological: Negative for headaches, focal weakness or numbness. 10-point ROS otherwise negative.  ____________________________________________   PHYSICAL EXAM:  VITAL SIGNS: ED Triage Vitals  Enc Vitals Group     BP 08/29/15 1209 144/93 mmHg     Pulse Rate 08/29/15 1209 100     Resp 08/29/15 1209 18     Temp 08/29/15 1209 98.2 F (36.8 C)     Temp src --      SpO2 08/29/15 1209 100 %     Weight 08/29/15 1209 250 lb (113.399 kg)     Height 08/29/15 1209  (1.778 m)     Head Cir --      Peak Flow --      Pain Score --      Pain Loc --      Pain Edu? --      Excl. in GC? --     Constitutional: Alert and oriented. Well appearing and in no acute distress. Eyes: Conjunctivae are normal. EOMI. Head: Atraumatic. Nose: No congestion/rhinnorhea. Neck: No stridor.  Respiratory: Normal respiratory effort. Cardiovascular: DP and PT pulse palpable.    Musculoskeletal: ROM very limited due to edema and pain. Compartments are soft. Neurologic:  Normal speech and language. No gross focal neurologic deficits are appreciated. Speech is normal. No gait instability. Skin:  Skin is warm, dry and intact. Atraumatic. Psychiatric: Mood and affect are normal. Speech and behavior are normal.  ____________________________________________   LABS (all labs ordered are listed, but only abnormal results are displayed)  Labs Reviewed - No data to display ____________________________________________  RADIOLOGY  Lateral malleolar fracture with mild displacement.  I, Kem Boroughs, personally viewed and evaluated these images (plain radiographs) as part of my medical decision making, as well as reviewing the written report by the radiologist.  ____________________________________________   PROCEDURES  Procedure(s) performed:   Posterior splint applied by ER tech. Neurovascularly intact post application.  ____________________________________________   INITIAL IMPRESSION /  ASSESSMENT AND PLAN / ED COURSE  Pertinent labs & imaging results that were available during my care of the patient were reviewed by me and considered in my medical decision making (see chart for details).  Initial fracture care provided. Follow-up with orthopedics will be greater than 24 hours.  Patient was given a walker instead of crutches due to stability issues. No narcotic prescription will be given today as patient is still taking methadone. He was advised to call [facility] clinic to schedule an appointment with his orthopedist or Dr. Joice Lofts. He was advised to return to the emergency department for symptoms of concern if unable to see the specialist.  ____________________________________________   FINAL CLINICAL IMPRESSION(S) / ED DIAGNOSES  Final diagnoses:  Fibula fracture, right, closed, initial encounter       [name], FNP 08/29/15 1525  Gayla Doss, MD 08/29/15 [phone]

## 2015-08-29 NOTE — ED Notes (Signed)
Fell last night - rt ankle / foot pain and swelling

## 2016-01-17 ENCOUNTER — Emergency Department
Admission: EM | Admit: 2016-01-17 | Discharge: 2016-01-17 | Disposition: A | Discharge status: Home / Self Care | Payer: Medicare Other | Attending: Emergency Medicine | Admitting: Emergency Medicine

## 2016-01-17 ENCOUNTER — Encounter: Payer: Self-pay | Admitting: Emergency Medicine

## 2016-01-17 DIAGNOSIS — Z79899 Other long term (current) drug therapy: Secondary | ICD-10-CM | POA: Diagnosis not present

## 2016-01-17 DIAGNOSIS — Y999 Unspecified external cause status: Secondary | ICD-10-CM | POA: Diagnosis not present

## 2016-01-17 DIAGNOSIS — S90861A Insect bite (nonvenomous), right foot, initial encounter: Secondary | ICD-10-CM | POA: Diagnosis not present

## 2016-01-17 DIAGNOSIS — W57XXXA Bitten or stung by nonvenomous insect and other nonvenomous arthropods, initial encounter: Secondary | ICD-10-CM | POA: Insufficient documentation

## 2016-01-17 DIAGNOSIS — F17210 Nicotine dependence, cigarettes, uncomplicated: Secondary | ICD-10-CM | POA: Insufficient documentation

## 2016-01-17 DIAGNOSIS — Y929 Unspecified place or not applicable: Secondary | ICD-10-CM | POA: Diagnosis not present

## 2016-01-17 DIAGNOSIS — F329 Major depressive disorder, single episode, unspecified: Secondary | ICD-10-CM | POA: Insufficient documentation

## 2016-01-17 DIAGNOSIS — Y939 Activity, unspecified: Secondary | ICD-10-CM | POA: Diagnosis not present

## 2016-01-17 DIAGNOSIS — I10 Essential (primary) hypertension: Secondary | ICD-10-CM | POA: Insufficient documentation

## 2016-01-17 DIAGNOSIS — R42 Dizziness and giddiness: Secondary | ICD-10-CM

## 2016-01-17 DIAGNOSIS — Z7982 Long term (current) use of aspirin: Secondary | ICD-10-CM | POA: Insufficient documentation

## 2016-01-17 DIAGNOSIS — R51 Headache: Secondary | ICD-10-CM | POA: Diagnosis present

## 2016-01-17 DIAGNOSIS — M1990 Unspecified osteoarthritis, unspecified site: Secondary | ICD-10-CM | POA: Diagnosis not present

## 2016-01-17 LAB — URINE DRUG SCREEN, QUALITATIVE (ARMC ONLY)
AMPHETAMINES, UR SCREEN: NOT DETECTED
BENZODIAZEPINE, UR SCRN: NOT DETECTED
Barbiturates, Ur Screen: NOT DETECTED
Cannabinoid 50 Ng, Ur ~~LOC~~: NOT DETECTED
Cocaine Metabolite,Ur ~~LOC~~: NOT DETECTED
MDMA (ECSTASY) UR SCREEN: NOT DETECTED
METHADONE SCREEN, URINE: POSITIVE — AB
OPIATE, UR SCREEN: NOT DETECTED
Phencyclidine (PCP) Ur S: NOT DETECTED
TRICYCLIC, UR SCREEN: POSITIVE — AB

## 2016-01-17 LAB — COMPREHENSIVE METABOLIC PANEL
ALBUMIN: 4.4 g/dL (ref 3.5–5.0)
ALT: 30 U/L (ref 17–63)
AST: 21 U/L (ref 15–41)
Alkaline Phosphatase: 70 U/L (ref 38–126)
Anion gap: 8 (ref 5–15)
BUN: 6 mg/dL (ref 6–20)
CHLORIDE: 93 mmol/L — AB (ref 101–111)
CO2: 30 mmol/L (ref 22–32)
Calcium: 9.4 mg/dL (ref 8.9–10.3)
Creatinine, Ser: 0.96 mg/dL (ref 0.61–1.24)
GFR calc Af Amer: >60 mL/min (ref >60)
GLUCOSE: 100 mg/dL — AB (ref 65–99)
POTASSIUM: 4.1 mmol/L (ref 3.5–5.1)
SODIUM: 131 mmol/L — AB (ref 135–145)
Total Bilirubin: 0.4 mg/dL (ref 0.3–1.2)
Total Protein: 7.4 g/dL (ref 6.5–8.1)

## 2016-01-17 LAB — URINALYSIS COMPLETE WITH MICROSCOPIC (ARMC ONLY)
BACTERIA UA: NONE SEEN
Bilirubin Urine: NEGATIVE
Glucose, UA: NEGATIVE mg/dL
Hgb urine dipstick: NEGATIVE
KETONES UR: NEGATIVE mg/dL
Leukocytes, UA: NEGATIVE
Nitrite: NEGATIVE
PROTEIN: NEGATIVE mg/dL
RBC / HPF: NONE SEEN RBC/hpf (ref 0–5)
SQUAMOUS EPITHELIAL / LPF: NONE SEEN
Specific Gravity, Urine: 1.003 — ABNORMAL LOW (ref 1.005–1.030)
pH: 7 (ref 5.0–8.0)

## 2016-01-17 LAB — CBC WITH DIFFERENTIAL/PLATELET
Basophils Absolute: 0.1 10*3/uL (ref 0–0.1)
Basophils Relative: 1 %
EOS PCT: 2 %
Eosinophils Absolute: 0.2 10*3/uL (ref 0–0.7)
HCT: 40.7 % (ref 40.0–52.0)
Hemoglobin: 14.3 g/dL (ref 13.0–18.0)
LYMPHS ABS: 2.7 10*3/uL (ref 1.0–3.6)
LYMPHS PCT: 26 %
MCH: 31.3 pg (ref 26.0–34.0)
MCHC: 35.1 g/dL (ref 32.0–36.0)
MCV: 89.1 fL (ref 80.0–100.0)
Monocytes Absolute: 0.6 10*3/uL (ref 0.2–1.0)
Monocytes Relative: 6 %
Neutro Abs: 6.7 10*3/uL — ABNORMAL HIGH (ref 1.4–6.5)
Neutrophils Relative %: 65 %
PLATELETS: 261 10*3/uL (ref 150–440)
RBC: 4.57 MIL/uL (ref 4.40–5.90)
RDW: 13.8 % (ref 11.5–14.5)
WBC: 10.2 10*3/uL (ref 3.8–10.6)

## 2016-01-17 MED ORDER — DOXYCYCLINE HYCLATE 100 MG PO TABS: 100.0000 mg | ORAL_TABLET | Freq: Two times a day (BID) | ORAL | Status: DC

## 2016-01-17 MED ORDER — MECLIZINE HCL 32 MG PO TABS: 32.0000 mg | ORAL_TABLET | Freq: Three times a day (TID) | ORAL | Status: DC | PRN

## 2016-01-17 NOTE — ED Provider Notes (Signed)
[facility] Emergency Department Provider Note ____________________________________________  Time seen: 1646  I have reviewed the triage vital signs and the nursing notes.  HISTORY  Chief Complaint  Dizziness; Headache; and Insect Bite  HPI Richard Davis is a 40 y.o. male presents to the ED accompanied by his mother for evaluation of multiple symptoms at a been persistent for the last 2-3 weeks. The patient describes dizziness that he describes on the order of about 3 or more weeks with intermittent nausea, vomiting, and decreased appetite.  He denies any vertigo or sense of the room spinning. He does describe that symptoms are relieved generally by laying down and closing his eyes. He denies any syncope related to the dizziness and denies any weakness, ringing in the ears, or visual disturbance. He does give a long-standing history of right ear hearing changesand notes intermittent and self-limited hearing loss on the right. He has been evaluated initially by [facility] ENT, and reports plans for further workup of this dizziness in about 2 weeks from now. He also describes a frontal headache that he describes as pressure that has been intermittent for the last 2-3 weeks. He describes he recently completed a course of antibiotics and prednisone for presumed sinusitis by his ENT specialist. He reports his symptoms did resolve at that time and he was unaware of any headaches. He additionally has an unrelated complaint of multiple insect bites to his lower legs and upper thigh. He describes small red bumps to the ankles as well as to the lateral aspect of his left thigh. He was also concerned for the fact he is poor several small ticks off of his body over the last several months. 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 He reports that he has been working with his clinic and his counselor to taper down his dose in an attempt to wean completely off of methadone. By his report, he has decreased his daily dose by a total of 30 mg, over the last 2 months. He reports his counselor transferred out, and he does not have a new counselor monitoring his response to tapering.   Past Medical History  Diagnosis Date  . Hypertension   . GERD (gastroesophageal reflux disease)   . Depression   . Arthritis     hands, foot, arm  . Hypercholesteremia     Patient Active Problem List   Diagnosis Date Noted  . Surgical complication involving left eye associated with non-ophthalmic procedure 06/17/2015  . Chest pain with high risk for cardiac etiology 01/27/2015  . Abnormal nuclear stress test 01/27/2015  . Chronic pain 01/27/2015  . Acid reflux 01/27/2015  . HLD (hyperlipidemia) 01/27/2015  . BP (high blood pressure) 01/27/2015  . Ache in joint 01/27/2015  . Sinus infection 01/27/2015  . Current tobacco use 01/27/2015  . Breathlessness on exertion 12/31/2014  . Breast development in males 02/10/2013  . Schizo-affective psychosis (HCC) 06/04/2012    Past Surgical History  Procedure Laterality Date  . Tonsillectomy    . Joint replacement    . Cardiac catheterization N/A 01/27/2015    Procedure: Left Heart Cath;  Surgeon: Marcina Millard, MD;  Location: [facility] INVASIVE CV LAB;  Service: Cardiovascular;  Laterality: N/A;  . Image guided sinus surgery N/A 06/17/2015    Procedure:  IMAGE GUIDED SINUS SURGERY;  Surgeon: Vernie Murders, MD;  Location: [facility] SURGERY CNTR;  Service: ENT;  Laterality: N/A;  GAVE DISK TO [name] PROPEL Gave 2nd disk to cece 10-24 kp  . Maxillary antrostomy Left 06/17/2015    Procedure: ENDOSCOPIC MAXILLARY ANTROSTOMY WITH REMOVAL CONTENTS;  Surgeon: Vernie Murders, MD;  Location: [facility] SURGERY CNTR;  Service: ENT;  Laterality: Left;  . Ethmoidectomy Left  06/17/2015    Procedure: ETHMOIDECTOMY REVISION and removal of polyps.;  Surgeon: Vernie Murders, MD;  Location: [facility];  Service: ENT;  Laterality: Left;  . Frontal sinus exploration Left 06/17/2015    Procedure: FRONTAL Sinusotomy with removal of polyps.;  Surgeon: Vernie Murders, MD;  Location: [facility];  Service: ENT;  Laterality: Left;  . Polypectomy Left 06/17/2015    Procedure: POLYPECTOMY NASAL;  Surgeon: Vernie Murders, MD;  Location: [facility] SUR[facility]ERY CN[facility];  Service: ENT;  Laterality: Left;    Current Outpatient Rx  Name  Route  Sig  Dispense  Refill  . albuterol (PROVENTIL HFA;VENTOLIN HFA) 108 (90 BASE) MCG/ACT inhaler   Inhalation   Inhale 2 puffs into the lungs every 6 (six) hours as needed for wheezing or shortness of breath.         Marland Kitchen amitriptyline (ELAVIL) 100 MG tablet   Oral   Take 100 mg by mouth at bedtime.         Marland Kitchen amoxicillin-clavulanate (AUGMENTIN) 875-125 MG tablet   Oral   Take 1 tablet by mouth 2 (two) times daily.         Marland Kitchen aspirin EC 81 MG tablet   Oral   Take 81 mg by mouth daily.         . bacitracin-polymyxin b (POLYSPORIN) ophthalmic ointment   Left Eye   Place 1 application into the left eye every 4 (four) hours while awake. apply to left eyelid every 4 hours while awake   3.5 g   0   . cabergoline (DOSTINEX) 0.5 MG tablet   Oral   Take 0.25 mg by mouth 2 (two) times a week.         . doxycycline (VIBRA-TABS) 100 MG tablet   Oral   Take 1 tablet (100 mg total) by mouth 2 (two) times daily.   14 tablet   0   . fluPHENAZine (PROLIXIN) 5 MG tablet   Oral   Take 5 mg by mouth daily.         . fluticasone (FLONASE) 50 MCG/ACT nasal spray   Each Nare   Place 2 sprays into both nostrils daily.         Marland Kitchen gabapentin (NEURONTIN) 300 MG capsule   Oral   Take 300 mg by mouth 3 (three) times daily.         . haloperidol (HALDOL) 10 MG tablet   Oral   Take 10 mg by mouth at bedtime.         . isosorbide  mononitrate (IMDUR) 30 MG 24 hr tablet   Oral   Take 30 mg by mouth daily.         Marland Kitchen ketoconazole (NIZORAL) 2 % cream   Topical   Apply 1 application topically 2 (two) times daily.   30 g   0   . lisinopril-hydrochlorothiazide (PRINZIDE,ZESTORETIC) 20-12.5 MG tablet   Oral   Take 1 tablet by mouth daily.         . meclizine (ANTIVERT) 32 MG tablet   Oral   Take 1  tablet (32 mg total) by mouth 3 (three) times daily as needed.   30 tablet   0   . meloxicam (MOBIC) 15 MG tablet   Oral   Take 1 tablet (15 mg total) by mouth daily.   30 tablet   0   . METHADONE HCL PO   Oral   Take 165 mg by mouth daily.         . methazolamide (NEPTAZANE) 50 MG tablet   Oral   Take 175 mg by mouth daily.         Marland Kitchen Phenylephrine-APAP-Guaifenesin (MUCINEX FAST-MAX COLD & SINUS) 5-325-200 MG TABS   Oral   Take by mouth as needed.         Marland Kitchen QUEtiapine (SEROQUEL) 300 MG tablet   Oral   Take 300 mg by mouth at bedtime.         . simvastatin (ZOCOR) 40 MG tablet   Oral   Take 40 mg by mouth daily.         . traZODone (DESYREL) 100 MG tablet   Oral   Take 100 mg by mouth at bedtime.           Allergies Review of patient's allergies indicates no known allergies.  No family history on file.  Social History Social History  Substance Use Topics  . Smoking status: Current Every Day Smoker -- 2.00 packs/day for 15 years    Types: Cigarettes  . Smokeless tobacco: Current User  . Alcohol Use: No    Review of Systems  Constitutional: Negative for fever. Eyes: Negative for visual changes. ENT: Negative for sore throat. Cardiovascular: Negative for chest pain. Respiratory: Negative for shortness of breath. Gastrointestinal: Negative for abdominal pain, vomiting and diarrhea. Genitourinary: Negative for dysuria. Musculoskeletal: Negative for back pain. Skin: Negative for rash. Neurological: Negative for headaches, focal weakness or  numbness. ____________________________________________  PHYSICAL EXAM:  VITAL SIGNS: ED Triage Vitals  Enc Vitals Group     BP 01/17/16 1607 127/87 mmHg     Pulse Rate 01/17/16 1607 107     Resp 01/17/16 1607 18     Temp 01/17/16 1607 98.3 F (36.8 C)     Temp Source 01/17/16 1607 Oral     SpO2 01/17/16 1607 95 %     Weight 01/17/16 1607 250 lb (113.399 kg)     Height 01/17/16 1607 5\' 11"  (1.803 m)     Head Cir --      Peak Flow --      Pain Score --      Pain Loc --      Pain Edu? --      Excl. in GC? --     Constitutional: Alert and oriented. Well appearing and in no distress. Head: Normocephalic and atraumatic.      Eyes: Conjunctivae are normal. PERRL. Normal extraocular movements      Ears: Canals clear. TMs intact bilaterally.   Nose: No congestion/rhinorrhea.   Mouth/Throat: Mucous membranes are moist.   Neck: Supple. No thyromegaly. Hematological/Lymphatic/Immunological: No cervical lymphadenopathy. Cardiovascular: Normal rate, regular rhythm.  Respiratory: Normal respiratory effort. No wheezes/rales/rhonchi. Gastrointestinal: Soft and nontender. No distention. Musculoskeletal: Nontender with normal range of motion in all extremities.  Neurologic:  Normal gait without ataxia. Normal speech and language. No gross focal neurologic deficits are appreciated. Skin:  Skin is warm, dry and intact. No rash noted. Psychiatric: Mood and affect are normal. Patient exhibits appropriate insight and judgment. ____________________________________________   LABS (pertinent positives/negatives) Labs  Reviewed  COMPREHENSIVE METABOLIC PANEL - Abnormal; Notable for the following:    Sodium 131 (*)    Chloride 93 (*)    Glucose, Bld 100 (*)    All other components within normal limits  CBC WITH DIFFERENTIAL/PLATELET - Abnormal; Notable for the following:    Neutro Abs 6.7 (*)    All other components within normal limits  URINALYSIS COMPLETEWITH MICROSCOPIC ([facility] ONLY)  - Abnormal; Notable for the following:    Color, Urine COLORLESS (*)    APPearance CLEAR (*)    Specific Gravity, Urine 1.003 (*)    All other components within normal limits  URINE DRUG SCREEN, QUALITATIVE ([facility] ONLY) - Abnormal; Notable for the following:    Tricyclic, Ur Screen POSITIVE (*)    Methadone Scn, Ur POSITIVE (*)    All other components within normal limits  ____________________________________________  INITIAL IMPRESSION / ASSESSMENT AND PLAN / ED COURSE  Patient with essentially normal clinical exam without any acute neuromuscular deficit. No indication of any focal neurological weakness. I wonder if his symptoms are in fact due to the tapering of his methadone. I encouraged him to speak with the counselor at the methadone clinic to stop tapering his dose until, if he stabilizes at this dose, and perhaps symptoms will resolve. He will be treated empirically for potential Lyme disease. [facility], with presumed natural immunity to RMSF. He will be discharged with prescription for doxycycline to dose as directed as well as meclizine dose as needed for dizziness. He will follow-up with [facility] as scheduled for further evaluation and workup of his complaint of dizziness. Return precautions are reviewed with the patient and his mother. ____________________________________________  FINAL CLINICAL IMPRESSION(S) / ED DIAGNOSES  Final diagnoses:  Dizziness, nonspecific  Tick bite of foot, right, initial encounter     Lissa HoardJenise V Bacon Yeiden Frenkel, PA-C 01/18/16 0054  Sharyn CreamerMark Quale, MD 01/19/16 2222

## 2016-01-17 NOTE — ED Notes (Signed)
See triage note had some tick bites to feet  Now having headache and dizzy  Afebrile on arrival

## 2016-01-17 NOTE — Discharge Instructions (Signed)
Dizziness Dizziness is a common problem. It is a feeling of unsteadiness or light-headedness. You may feel like you are about to faint. Dizziness can lead to injury if you stumble or fall. Anyone can become dizzy, but dizziness is more common in older adults. This condition can be caused by a number of things, including medicines, dehydration, or illness. HOME CARE INSTRUCTIONS Taking these steps may help with your condition: Eating and Drinking  Drink enough fluid to keep your urine clear or pale yellow. This helps to keep you from becoming dehydrated. Try to drink more clear fluids, such as water.  Do not drink alcohol.  Limit your caffeine intake if directed by your health care provider.  Limit your salt intake if directed by your health care provider. Activity 1. Avoid making quick movements. 1. Rise slowly from chairs and steady yourself until you feel okay. 2. In the morning, first sit up on the side of the bed. When you feel okay, stand slowly while you hold onto something until you know that your balance is fine. 2. Move your legs often if you need to stand in one place for a long time. Tighten and relax your muscles in your legs while you are standing. 3. Do not drive or operate heavy machinery if you feel dizzy. 4. Avoid bending down if you feel dizzy. Place items in your home so that they are easy for you to reach without leaning over. Lifestyle  Do not use any tobacco products, including cigarettes, chewing tobacco, or electronic cigarettes. If you need help quitting, ask your health care provider.  Try to reduce your stress level, such as with yoga or meditation. Talk with your health care provider if you need help. General Instructions  Watch your dizziness for any changes.  Take medicines only as directed by your health care provider. Talk with your health care provider if you think that your dizziness is caused by a medicine that you are taking.  Tell a friend or a  family member that you are feeling dizzy. If he or she notices any changes in your behavior, have this person call your health care provider.  Keep all follow-up visits as directed by your health care provider. This is important. SEEK MEDICAL CARE IF:  Your dizziness does not go away.  Your dizziness or light-headedness gets worse.  You feel nauseous.  You have reduced hearing.  You have new symptoms.  You are unsteady on your feet or you feel like the room is spinning. SEEK IMMEDIATE MEDICAL CARE IF:  You vomit or have diarrhea and are unable to eat or drink anything.  You have problems talking, walking, swallowing, or using your arms, hands, or legs.  You feel generally weak.  You are not thinking clearly or you have trouble forming sentences. It may take a friend or family member to notice this.  You have chest pain, abdominal pain, shortness of breath, or sweating.  Your vision changes.  You notice any bleeding.  You have a headache.  You have neck pain or a stiff neck.  You have a fever.   This information is not intended to replace advice given to you by your health care provider. Make sure you discuss any questions you have with your health care provider.   Document Released: 01/17/2001 Document Revised: 12/08/2014 Document Reviewed: 07/20/2014 Elsevier Interactive Patient Education 2016 [employer]  Insect Bite Mosquitoes, flies, fleas, bedbugs, and other insects can bite. Insect bites are different from insect stings.  The bite may be red, puffy (swollen), and itchy for 2 to 4 days. Most bites get better on their own. HOME CARE   Do not scratch the bite.  Keep the bite clean and dry. Wash the bite with soap and water every day, as told by your doctor.  If directed, apply ice to the bite area.  Put ice in a plastic bag.  Place a towel between your skin and the bag.  Leave the ice on for 20 minutes, 2-3 times per day.  Follow instructions from your  doctor about using medicated lotions or creams. These can help with itching.  Apply or take over-the-counter and prescription medicines only as told by your doctor.  If you were given an antibiotic medicine, use it as told by your doctor. Do not stop using the medicine even if your condition improves.  Keep all follow-up visits as told by your doctor. This is important. GET HELP IF: 5. You have redness, swelling (inflammation), or pain near your bite that is getting worse. 6. You have a fever. GET HELP RIGHT AWAY IF:   You have joint pain.   You have fluid, blood, or pus coming from the bite area.   You have a headache.  You have neck pain.  You feel weaker than you normally do.   You have a rash.   You have chest pain.  You have shortness of breath.  You have stomach pain, feel sick to your stomach (nauseous), or throw up (vomit).  You feel more tired or sleepy than you normally do.   This information is not intended to replace advice given to you by your health care provider. Make sure you discuss any questions you have with your health care provider.   Document Released: 07/21/2000 Document Revised: 04/14/2015 Document Reviewed: 12/09/2014 Elsevier Interactive Patient Education 2016 [employer] Inc[employer]  Tick Bite Information Ticks are insects that attach themselves to the skin. There are many types of ticks. Common types include wood ticks and deer ticks. Sometimes, ticks carry diseases that can make a person very ill. The most common places for ticks to attach themselves are the scalp, neck, armpits, waist, and groin.  HOW CAN YOU PREVENT TICK BITES? Take these steps to help prevent tick bites when you are outdoors:  Wear long sleeves and long pants.  Wear white clothes so you can see ticks more easily.  Tuck your pant legs into your socks.  If walking on a trail, stay in the middle of the trail to avoid brushing against bushes.  Avoid walking through areas with  long grass.  Put bug spray on all skin that is showing and along boot tops, pant legs, and sleeve cuffs.  Check clothes, hair, and skin often and before going inside.  Brush off any ticks that are not attached.  Take a shower or bath as soon as possible after being outdoors. HOW SHOULD YOU REMOVE A TICK? Ticks should be removed as soon as possible to help prevent diseases. 7. If latex gloves are available, put them on before trying to remove a tick. 8. Use tweezers to grasp the tick as close to the skin as possible. You may also use curved forceps or a tick removal tool. Grasp the tick as close to its head as possible. Avoid grasping the tick on its body. 9. Pull gently upward until the tick lets go. Do not twist the tick or jerk it suddenly. This may break off the tick's head or mouth  parts. 10. Do not squeeze or crush the tick's body. This could force disease-carrying fluids from the tick into your body. 11. After the tick is removed, wash the bite area and your hands with soap and water or alcohol. 12. Apply a small amount of antiseptic cream or ointment to the bite site. 13. Wash any tools that were used. Do not try to remove a tick by applying a hot match, petroleum jelly, or fingernail polish to the tick. These methods do not work. They may also increase the chances of disease being spread from the tick bite. WHEN SHOULD YOU SEEK HELP? Contact your health care provider if you are unable to remove a tick or if a part of the tick breaks off in the skin. After a tick bite, you need to watch for signs and symptoms of diseases that can be spread by ticks. Contact your health care provider if you develop any of the following:  Fever.  Rash.  Redness and puffiness (swelling) in the area of the tick bite.  Tender, puffy lymph glands.  Watery poop (diarrhea).  Weight loss.  Cough.  Feeling more tired than normal (fatigue).  Muscle, joint, or bone pain.  Belly (abdominal)  pain.  Headache.  Change in your level of consciousness.  Trouble walking or moving your legs.  Loss of feeling (numbness) in the legs.  Loss of movement (paralysis).  Shortness of breath.  Confusion.  Throwing up (vomiting) many times.   This information is not intended to replace advice given to you by your health care provider. Make sure you discuss any questions you have with your health care provider.   Document Released: 10/18/2009 Document Revised: 03/26/2013 Document Reviewed: 01/01/2013 Elsevier Interactive Patient Education [employer]   Your exam and labs appear normal at this time. Your Lyme disease lab is pending for the next 2-3 days. Start the antibiotic and continue if lab test is confirmed. Take the anti-vertigo medicine as needed. Follow-up with your ENT provider as scheduled. Consider follow-up with your methadone clinic provider for slower tapering of your dose as discussed. Return to the ED immediately for worsening symptoms or weakness as discussed.

## 2016-01-17 NOTE — ED Notes (Signed)
Pt presents with possible tick bites to both feet and has had headaches and dizziness for two weeks.

## 2016-01-22 ENCOUNTER — Other Ambulatory Visit: Payer: Self-pay

## 2016-01-22 ENCOUNTER — Emergency Department
Admission: EM | Admit: 2016-01-22 | Discharge: 2016-01-22 | Disposition: A | Discharge status: Home / Self Care | Payer: Medicare Other | Attending: Emergency Medicine | Admitting: Emergency Medicine

## 2016-01-22 ENCOUNTER — Encounter: Payer: Self-pay | Admitting: Emergency Medicine

## 2016-01-22 DIAGNOSIS — J029 Acute pharyngitis, unspecified: Secondary | ICD-10-CM

## 2016-01-22 DIAGNOSIS — F329 Major depressive disorder, single episode, unspecified: Secondary | ICD-10-CM | POA: Insufficient documentation

## 2016-01-22 DIAGNOSIS — Z791 Long term (current) use of non-steroidal anti-inflammatories (NSAID): Secondary | ICD-10-CM | POA: Diagnosis not present

## 2016-01-22 DIAGNOSIS — E785 Hyperlipidemia, unspecified: Secondary | ICD-10-CM | POA: Insufficient documentation

## 2016-01-22 DIAGNOSIS — Z792 Long term (current) use of antibiotics: Secondary | ICD-10-CM | POA: Insufficient documentation

## 2016-01-22 DIAGNOSIS — I10 Essential (primary) hypertension: Secondary | ICD-10-CM | POA: Diagnosis not present

## 2016-01-22 DIAGNOSIS — M19049 Primary osteoarthritis, unspecified hand: Secondary | ICD-10-CM | POA: Insufficient documentation

## 2016-01-22 DIAGNOSIS — Z7982 Long term (current) use of aspirin: Secondary | ICD-10-CM | POA: Diagnosis not present

## 2016-01-22 DIAGNOSIS — F259 Schizoaffective disorder, unspecified: Secondary | ICD-10-CM | POA: Diagnosis not present

## 2016-01-22 DIAGNOSIS — Z79899 Other long term (current) drug therapy: Secondary | ICD-10-CM | POA: Insufficient documentation

## 2016-01-22 DIAGNOSIS — M19079 Primary osteoarthritis, unspecified ankle and foot: Secondary | ICD-10-CM | POA: Diagnosis not present

## 2016-01-22 DIAGNOSIS — F17210 Nicotine dependence, cigarettes, uncomplicated: Secondary | ICD-10-CM | POA: Diagnosis not present

## 2016-01-22 DIAGNOSIS — R42 Dizziness and giddiness: Secondary | ICD-10-CM

## 2016-01-22 LAB — POCT RAPID STREP A: STREPTOCOCCUS, GROUP A SCREEN (DIRECT): NEGATIVE

## 2016-01-22 MED ORDER — PSEUDOEPH-BROMPHEN-DM 30-2-10 MG/5ML PO SYRP: 5.0000 mL | ORAL_SOLUTION | Freq: Four times a day (QID) | ORAL | Status: DC | PRN

## 2016-01-22 NOTE — ED Notes (Signed)
Discussed discharge instructions, prescriptions, and follow-up care with patient. No questions or concerns at this time. Pt stable at discharge.  

## 2016-01-22 NOTE — Discharge Instructions (Signed)
Continue previous medications

## 2016-01-22 NOTE — ED Provider Notes (Signed)
[facility] Emergency Department Provider Note   ____________________________________________  Time seen: Approximately 3:00 PM  I have reviewed the triage vital signs and the nursing notes.   HISTORY  Chief Complaint Sore Throat    HPI Richard Davis is a 40 y.o. male patient here was mother complaining of sore throat which began this morning. Patient also states state there was fever. Patient also complaining of nasal congestion and a nonproductive cough. Patient also complaining of some neck stiffness. Patient also complaining of some chest pain. Patient states sore throat has improved since this morning. Patient is currently taking doxycycline for possible Lyme exposure. Patient also states that he was given Antivert for for vertigo but he does not believe it is working. Patient has been seen by ENT specialist and has a follow-up in a couple weeks for this complaint of vertigo. 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 Patient state he is unable to follow up with his counselor in the last 2 months to discuss his tapering process. Patient say he has been tapered his methadone for the past 2 months. But his counselor has been transferred and no replacement as of this date. Patient rates his overall pain discomfort as a 2/10. His mother states that he is downplaying his pain level is approximately 8-9/10. Mother is insisting on a complete cardiac workup and repeat labs from his last visit 5 days ago. I observed the patient walking to the bathroom with no signs of vertigo.  Past Medical History  Diagnosis Date  . Hypertension   . GERD (gastroesophageal reflux disease)   . Depression   . Arthritis     hands, foot, arm  . Hypercholesteremia     Patient Active Problem List   Diagnosis Date Noted  . Surgical complication involving left eye associated with non-ophthalmic procedure 06/17/2015  . Chest pain with high risk  for cardiac etiology 01/27/2015  . Abnormal nuclear stress test 01/27/2015  . Chronic pain 01/27/2015  . Acid reflux 01/27/2015  . HLD (hyperlipidemia) 01/27/2015  . BP (high blood pressure) 01/27/2015  . Ache in joint 01/27/2015  . Sinus infection 01/27/2015  . Current tobacco use 01/27/2015  . Breathlessness on exertion 12/31/2014  . Breast development in males 02/10/2013  . Schizo-affective psychosis (HCC) 06/04/2012    Past Surgical History  Procedure Laterality Date  . Tonsillectomy    . Joint replacement    . Cardiac catheterization N/A 01/27/2015    Procedure: Left Heart Cath;  Surgeon: Marcina Millard, MD;  Location: [facility] INV[facility] CV LAB;  Service: Cardiovascular;  Laterality: N/A;  . Image guided sinus surgery N/A 06/17/2015    Procedure: IMAGE GUIDED SINUS SURGERY;  Surgeon: Vernie Murders, MD;  Location: [facility];  Service: ENT;  Laterality: N/A;  GAVE DISK TO [name] PROPEL Gave 2nd disk to cece 10-24 kp  . Maxillary antrostomy Left 06/17/2015    Procedure: ENDOSCOPIC MAXILLARY ANTROSTOMY WITH REMOVAL CONTENTS;  Surgeon: Vernie Murders, MD;  Location: [facility];  Service: ENT;  Laterality: Left;  . Ethmoidectomy Left 06/17/2015    Procedure: ETHMOIDECTOMY REVISION and removal of polyps.;  Surgeon: Vernie Murders, MD;  Location: [facility];  Service: ENT;  Laterality: Left;  . Frontal sinus exploration Left 06/17/2015    Procedure: FRONTAL Sinusotomy with removal of polyps.;  Surgeon: Vernie Murders, MD;  Location: [facility];  Service: ENT;  Laterality: Left;  . Polypectomy Left 06/17/2015  Procedure: POLYPECTOMY NASAL;  Surgeon: Paul Juengel, MD;  [facility];  Service: ENT;  Laterality: Left;    [facility] Rx  Name  Route  Sig  Dispense  Refill  . albuterol (PROVENTIL HFA;VENTOLIN HFA) 108 (90 BASE) MCG/ACT inhaler   Inhalation   Inhale 2 puffs into the lungs every 6 (six) hours as needed for wheezing or  shortness of breath.         Marland Kitchen. amitriptyline (ELAVIL) 100 MG tablet   Oral   Take 100 mg by mouth at bedtime.         Marland Kitchen. amoxicillin-clavulanate (AUGMENTIN) 875-125 MG tablet   Oral   Take 1 tablet by mouth 2 (two) times daily.         Marland Kitchen. aspirin EC 81 MG tablet   Oral   Take 81 mg by mouth daily.         . bacitracin-polymyxin b (POLYSPORIN) ophthalmic ointment   Left Eye   Place 1 application into the left eye every 4 (four) hours while awake. apply to left eyelid every 4 hours while awake   3.5 g   0   . brompheniramine-pseudoephedrine-DM 30-2-10 MG/5ML syrup   Oral   Take 5 mLs by mouth 4 (four) times daily as needed.   120 mL   0   . cabergoline (DOSTINEX) 0.5 MG tablet   Oral   Take 0.25 mg by mouth 2 (two) times a week.         . doxycycline (VIBRA-TABS) 100 MG tablet   Oral   Take 1 tablet (100 mg total) by mouth 2 (two) times daily.   14 tablet   0   . fluPHENAZine (PROLIXIN) 5 MG tablet   Oral   Take 5 mg by mouth daily.         . fluticasone (FLONASE) 50 MCG/ACT nasal spray   Each Nare   Place 2 sprays into both nostrils daily.         Marland Kitchen. gabapentin (NEURONTIN) 300 MG capsule   Oral   Take 300 mg by mouth 3 (three) times daily.         . haloperidol (HALDOL) 10 MG tablet   Oral   Take 10 mg by mouth at bedtime.         . isosorbide mononitrate (IMDUR) 30 MG 24 hr tablet   Oral   Take 30 mg by mouth daily.         Marland Kitchen. ketoconazole (NIZORAL) 2 % cream   Topical   Apply 1 application topically 2 (two) times daily.   30 g   0   . lisinopril-hydrochlorothiazide (PRINZIDE,ZESTORETIC) 20-12.5 MG tablet   Oral   Take 1 tablet by mouth daily.         . meclizine (ANTIVERT) 32 MG tablet   Oral   Take 1 tablet (32 mg total) by mouth 3 (three) times daily as needed.   30 tablet   0   . meloxicam (MOBIC) 15 MG tablet   Oral   Take 1 tablet (15 mg total) by mouth daily.   30 tablet   0   . METHADONE HCL PO   Oral   Take 165  mg by mouth daily.         . methazolamide (NEPTAZANE) 50 MG tablet   Oral   Take 175 mg by mouth daily.         Marland Kitchen. Phenylephrine-APAP-Guaifenesin (MUCINEX FAST-MAX COLD & SINUS) 5-325-200 MG  TABS   Oral   Take by mouth as needed.         Marland Kitchen QUEtiapine (SEROQUEL) 300 MG tablet   Oral   Take 300 mg by mouth at bedtime.         . simvastatin (ZOCOR) 40 MG tablet   Oral   Take 40 mg by mouth daily.         . traZODone (DESYREL) 100 MG tablet   Oral   Take 100 mg by mouth at bedtime.           Allergies Review of patient's allergies indicates no known allergies.  No family history on file.  Social History Social History  Substance Use Topics  . Smoking status: Current Every Day Smoker -- 2.00 packs/day for 15 years    Types: Cigarettes  . Smokeless tobacco: Current User  . Alcohol Use: No    Review of Systems Constitutional: No fever/chills Eyes: No visual changes. ENT: No sore throat. Cardiovascular: Denies chest pain. Respiratory: Denies shortness of breath. Gastrointestinal: No abdominal pain.  No nausea, no vomiting.  No diarrhea.  No constipation. Genitourinary: Negative for dysuria. Musculoskeletal: Negative for back pain. Skin: Negative for rash. Neurological: Negative for headaches, focal weakness or numbness. Patient states intermittent vertigo Endocrine:Hypertension and hyperlipidemia ____________________________________________   PHYSICAL EXAM:  VITAL SIGNS: ED Triage Vitals  Enc Vitals Group     BP 01/22/16 1408 122/88 mmHg     Pulse Rate 01/22/16 1408 93     Resp 01/22/16 1408 18     Temp 01/22/16 1408 97.6 F (36.4 C)     Temp Source 01/22/16 1408 Oral     SpO2 01/22/16 1408 94 %     Weight 01/22/16 1408 250 lb (113.399 kg)     Height 01/22/16 1408  (1.778 m)     Head Cir --      Peak Flow --      Pain Score 01/22/16 1409 2     Pain Loc --      Pain Edu? --      Excl. in GC? --     Constitutional: Alert and  oriented. Well appearing and in no acute distress. Eyes: Conjunctivae are normal. PERRL. EOMI. Head: Atraumatic. Nose: Edematous bilateral nasal turbinates with clear rhinorrhea.  Mouth/Throat: Mucous membranes are moist.  Oropharynx non-erythematous.Copious postnasal drainage. Neck: No stridor.  No cervical spine tenderness to palpation. Hematological/Lymphatic/Immunilogical: No cervical lymphadenopathy. Cardiovascular: Normal rate, regular rhythm. Grossly normal heart sounds.  Good peripheral circulation. Respiratory: Normal respiratory effort.  No retractions. Lungs CTAB. Gastrointestinal: Soft and nontender. No distention. No abdominal bruits. No CVA tenderness. Musculoskeletal: No lower extremity tenderness nor edema.  No joint effusions. Neurologic:  Normal speech and language. No gross focal neurologic deficits are appreciated. No gait instability. Skin:  Skin is warm, dry and intact. No rash noted. Psychiatric: Mood and affect are normal. Speech and behavior are normal.  ____________________________________________   LABS (all labs ordered are listed, but only abnormal results are displayed)  Labs Reviewed  POCT RAPID STREP A   ____________________________________________  EKG  EKG showed normal sinus rhythm. EKG read by heart station doctor. ____________________________________________  RADIOLOGY   ____________________________________________   PROCEDURES  Procedure(s) performed: None  Critical Care performed: No  ____________________________________________   INITIAL IMPRESSION / ASSESSMENT AND PLAN / ED COURSE  Pertinent labs & imaging results that were available during my care of the patient were reviewed by me and considered in my medical decision making (  see chart for details).  Pharyngitis and continue vertigo. Advised patient and his intermittent dizziness might be coming from his on monitoring tapering of methadone. Advised to contact a treatment  center for new counselor. Advised patient to follow-up with ENT clinic for reevaluation of his vertigo. Patient given a prescription for Bromfed-DM and discharged instructions. ____________________________________________   FINAL CLINICAL IMPRESSION(S) / ED DIAGNOSES  Final diagnoses:  Viral pharyngitis  Vertigo      NEW MEDICATIONS STARTED DURING THIS VISIT:  New Prescriptions   BROMPHENIRAMINE-PSEUDOEPHEDRINE-DM 30-2-10 MG/5ML SYRUP    Take 5 mLs by mouth 4 (four) times daily as needed.     Note:  This document was prepared using Dragon voice recognition software and may include unintentional dictation errors.    Joni Reining, PA-C 01/22/16 1549  Sharyn Creamer, MD 01/23/16 [phone]

## 2016-01-22 NOTE — ED Notes (Signed)
States has vomited x 3 today but keeping fluids down.

## 2016-01-22 NOTE — ED Notes (Signed)
States began sore throat began this am with fever. Currently on doxycycline for possible Lyme exposure.

## 2016-01-22 NOTE — ED Notes (Signed)
Pt reports dizziness, vomiting and fever for the past month, states he was tested lyme disease last time he was here and would like to know the results
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
## 2016-03-20 ENCOUNTER — Ambulatory Visit (INDEPENDENT_AMBULATORY_CARE_PROVIDER_SITE_OTHER): Payer: Medicare Other | Admitting: Urology

## 2016-03-20 ENCOUNTER — Telehealth: Payer: Self-pay | Admitting: Urology

## 2016-03-20 ENCOUNTER — Encounter: Payer: Self-pay | Admitting: Urology

## 2016-03-20 VITALS — BP 111/76 | HR 96 | Ht 70.0 in | Wt 248.5 lb

## 2016-03-20 DIAGNOSIS — N528 Other male erectile dysfunction: Secondary | ICD-10-CM | POA: Diagnosis not present

## 2016-03-20 DIAGNOSIS — E291 Testicular hypofunction: Secondary | ICD-10-CM

## 2016-03-20 DIAGNOSIS — N529 Male erectile dysfunction, unspecified: Secondary | ICD-10-CM

## 2016-03-20 DIAGNOSIS — N401 Enlarged prostate with lower urinary tract symptoms: Secondary | ICD-10-CM | POA: Diagnosis not present

## 2016-03-20 DIAGNOSIS — N138 Other obstructive and reflux uropathy: Secondary | ICD-10-CM

## 2016-03-21 NOTE — Telephone Encounter (Signed)
As we discussed at his visit, he will need to have his sleep study conducted to rule out sleep apnea.

## 2016-03-22 NOTE — Telephone Encounter (Signed)
Spoke with pt in reference to needing a sleep study prior to testosterone replacement. Pt stated that he will get cardiac clearance from cardiologist also. Reinforced with pt once he has had his sleep study and cardiac clearance in hand to give us a call back. Pt voiced understanding of whole conversation.

## 2016-03-27 DIAGNOSIS — E291 Testicular hypofunction: Secondary | ICD-10-CM | POA: Insufficient documentation

## 2016-05-15 ENCOUNTER — Other Ambulatory Visit: Payer: Self-pay | Admitting: Neurology

## 2016-05-15 DIAGNOSIS — H538 Other visual disturbances: Secondary | ICD-10-CM

## 2016-05-23 ENCOUNTER — Ambulatory Visit
Admission: RE | Admit: 2016-05-23 | Discharge: 2016-05-23 | Disposition: A | Discharge status: Home / Self Care | Payer: Medicare Other | Source: Ambulatory Visit | Attending: Neurology | Admitting: Neurology

## 2016-05-23 DIAGNOSIS — R9089 Other abnormal findings on diagnostic imaging of central nervous system: Secondary | ICD-10-CM | POA: Insufficient documentation

## 2016-05-23 DIAGNOSIS — H538 Other visual disturbances: Secondary | ICD-10-CM | POA: Diagnosis present

## 2016-05-23 DIAGNOSIS — R2689 Other abnormalities of gait and mobility: Secondary | ICD-10-CM | POA: Diagnosis not present

## 2016-05-23 MED ORDER — GADOBENATE DIMEGLUMINE 529 MG/ML IV SOLN
20.0000 mL | Freq: Once | INTRAVENOUS | Status: AC | PRN
  Administered 2016-05-23: 20 mL via INTRAVENOUS

## 2016-09-11 ENCOUNTER — Encounter: Payer: Self-pay | Admitting: Intensive Care

## 2016-09-11 ENCOUNTER — Emergency Department: Payer: Medicare Other

## 2016-09-11 ENCOUNTER — Emergency Department
Admission: EM | Admit: 2016-09-11 | Discharge: 2016-09-11 | Disposition: A | Discharge status: Home / Self Care | Payer: Medicare Other | Attending: Emergency Medicine | Admitting: Emergency Medicine

## 2016-09-11 DIAGNOSIS — Z79899 Other long term (current) drug therapy: Secondary | ICD-10-CM | POA: Diagnosis not present

## 2016-09-11 DIAGNOSIS — R1032 Left lower quadrant pain: Secondary | ICD-10-CM | POA: Diagnosis present

## 2016-09-11 DIAGNOSIS — K529 Noninfective gastroenteritis and colitis, unspecified: Secondary | ICD-10-CM | POA: Diagnosis not present

## 2016-09-11 DIAGNOSIS — I10 Essential (primary) hypertension: Secondary | ICD-10-CM | POA: Insufficient documentation

## 2016-09-11 DIAGNOSIS — F17210 Nicotine dependence, cigarettes, uncomplicated: Secondary | ICD-10-CM | POA: Insufficient documentation

## 2016-09-11 LAB — COMPREHENSIVE METABOLIC PANEL
ALK PHOS: 78 U/L (ref 38–126)
ALT: 37 U/L (ref 17–63)
AST: 29 U/L (ref 15–41)
Albumin: 4.3 g/dL (ref 3.5–5.0)
Anion gap: 9 (ref 5–15)
BILIRUBIN TOTAL: 0.4 mg/dL (ref 0.3–1.2)
BUN: 7 mg/dL (ref 6–20)
CALCIUM: 9 mg/dL (ref 8.9–10.3)
CO2: 29 mmol/L (ref 22–32)
CREATININE: 1.1 mg/dL (ref 0.61–1.24)
Chloride: 96 mmol/L — ABNORMAL LOW (ref 101–111)
Glucose, Bld: 90 mg/dL (ref 65–99)
Potassium: 3.7 mmol/L (ref 3.5–5.1)
Sodium: 134 mmol/L — ABNORMAL LOW (ref 135–145)
TOTAL PROTEIN: 7.3 g/dL (ref 6.5–8.1)

## 2016-09-11 LAB — CBC
HCT: 40.7 % (ref 40.0–52.0)
HEMOGLOBIN: 14.3 g/dL (ref 13.0–18.0)
MCH: 31.5 pg (ref 26.0–34.0)
MCHC: 35 g/dL (ref 32.0–36.0)
MCV: 90.1 fL (ref 80.0–100.0)
PLATELETS: 283 10*3/uL (ref 150–440)
RBC: 4.52 MIL/uL (ref 4.40–5.90)
RDW: 13.5 % (ref 11.5–14.5)
WBC: 12.1 10*3/uL — AB (ref 3.8–10.6)

## 2016-09-11 LAB — URINALYSIS, COMPLETE (UACMP) WITH MICROSCOPIC
BILIRUBIN URINE: NEGATIVE
Bacteria, UA: NONE SEEN
GLUCOSE, UA: NEGATIVE mg/dL
HGB URINE DIPSTICK: NEGATIVE
Ketones, ur: NEGATIVE mg/dL
Leukocytes, UA: NEGATIVE
NITRITE: NEGATIVE
PROTEIN: NEGATIVE mg/dL
RBC / HPF: NONE SEEN RBC/hpf (ref 0–5)
SPECIFIC GRAVITY, URINE: 1.004 — AB (ref 1.005–1.030)
Squamous Epithelial / LPF: NONE SEEN
pH: 7 (ref 5.0–8.0)

## 2016-09-11 LAB — LIPASE, BLOOD: Lipase: 21 U/L (ref 11–51)

## 2016-09-11 MED ORDER — DICYCLOMINE HCL 20 MG PO TABS: 20.0000 mg | ORAL_TABLET | Freq: Three times a day (TID) | ORAL | 0 refills | Status: DC | PRN

## 2016-09-11 MED ORDER — SODIUM CHLORIDE 0.9 % IV BOLUS (SEPSIS)
500.0000 mL | Freq: Once | INTRAVENOUS | Status: AC
  Administered 2016-09-11: 500 mL via INTRAVENOUS

## 2016-09-11 MED ORDER — IOPAMIDOL (ISOVUE-300) INJECTION 61%
100.0000 mL | Freq: Once | INTRAVENOUS | Status: AC | PRN
  Administered 2016-09-11: 100 mL via INTRAVENOUS

## 2016-09-11 MED ORDER — IOPAMIDOL (ISOVUE-300) INJECTION 61%
30.0000 mL | Freq: Once | INTRAVENOUS | Status: AC | PRN
  Administered 2016-09-11: 30 mL via ORAL

## 2016-09-11 MED ORDER — METRONIDAZOLE 500 MG PO TABS: 500.0000 mg | ORAL_TABLET | Freq: Two times a day (BID) | ORAL | 0 refills | Status: AC

## 2016-09-11 MED ORDER — KETOROLAC TROMETHAMINE 30 MG/ML IJ SOLN
30.0000 mg | Freq: Once | INTRAMUSCULAR | Status: AC
  Administered 2016-09-11: 30 mg via INTRAVENOUS
  Filled 2016-09-11: qty 1

## 2016-09-11 NOTE — ED Triage Notes (Addendum)
PAtient presents to ER with c/o lower abdominal pain. PAtient reports having multiple episodes of diarrhea Sunday night/Saturday morning that is now subsided. Having extreme pain in lower abdomen. Pt ambulatory in triage. Patient appears to be in pain in triage. A&O x3. Denies chest pain or SOB at this time. HX constipation issues

## 2016-09-11 NOTE — Discharge Instructions (Signed)
Your CAT scan of the abdomen shows a small amount of colitis in the left side of the colon. Please return for a fever, large bloody stools, increased pain, or any other concerns.   Please return immediately if condition worsens. Please contact her primary physician or the physician you were given for referral. If you have any specialist physicians involved in her treatment and plan please also contact them. Thank you for using [facility].

## 2016-09-11 NOTE — ED Provider Notes (Signed)
Time Seen: Approximately 1913  I have reviewed the triage notes  Chief Complaint: No chief complaint on file.   History of Present Illness: Richard Davis is a 41 y.o. male who presents with lower abdominal pain left side worse than the right which started over the weekend. Patient states he's had history of constipation and then had that followed by loose watery stool which seemed to be similar though this time he is having left-sided lower abdominal discomfort. He saw his primary "" Dr. Marland Kitchen"" he referred him here for evaluation of a possible bowel obstruction, although the patient hasn't had any previous history of abdominal surgery. He has had some difficulty initiating urination but denies any low back pain etc.  Past Medical History:  Diagnosis Date  . Arthritis    hands, foot, arm  . Depression   . GERD (gastroesophageal reflux disease)   . Hypercholesteremia   . Hypertension     Patient Active Problem List   Diagnosis Date Noted  . Surgical complication involving left eye associated with non-ophthalmic procedure 06/17/2015  . Hx of cardiac catheterization 02/03/2015  . 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 Chest pain with high risk for cardiac etiology 01/27/2015  . Abnormal nuclear stress test 01/27/2015  . Chronic pain 01/27/2015  . Acid reflux 01/27/2015  . Hyperlipidemia 01/27/2015  . BP (high blood pressure) 01/27/2015  . Joint pain 01/27/2015  . Sinusitis 01/27/2015  . Current tobacco use 01/27/2015  . Breathlessness on exertion 12/31/2014  . Breast development in males 02/10/2013  . Schizoaffective disorder (HCC) 06/04/2012    Past Surgical History:  Procedure Laterality Date  . CARDIAC CATHETERIZATION N/A 01/27/2015   Procedure: Left Heart Cath;  Surgeon: Marcina MillardAlexander Paraschos, MD;  [facility];  Service: Cardiovascular;  Laterality: N/A;  . ETHMOIDECTOMY Left 06/17/2015   Procedure: ETHMOIDECTOMY REVISION and removal of  polyps.;  Surgeon: Vernie MurdersPaul Juengel, MD;  [facility];  Service: ENT;  Laterality: Left;  . FRONTAL SINUS EXPLORATION Left 06/17/2015   Procedure: FRONTAL Sinusotomy with removal of polyps.;  Surgeon: Vernie MurdersPaul Juengel, MD;  [facility];  Service: ENT;  Laterality: Left;  . IMAGE GUIDED SINUS SURGERY N/A 06/17/2015   Procedure: IMAGE GUIDED SINUS SURGERY;  Surgeon: Vernie MurdersPaul Juengel, MD;  [facility];  Service: ENT;  Laterality: N/A;  GAVE DISK TO [name] PROPEL Gave 2nd disk to cece 10-24 kp  . JOINT REPLACEMENT    . MAXILLARY ANTROSTOMY Left 06/17/2015   Procedure: ENDOSCOPIC MAXILLARY ANTROSTOMY WITH REMOVAL CONTENTS;  Surgeon: Vernie MurdersPaul Juengel, MD;  [facility];  Service: ENT;  Laterality: Left;  . POLYPECTOMY Left 06/17/2015   Procedure: POLYPECTOMY NASAL;  Surgeon: Vernie MurdersPaul Juengel, MD;  [facility];  Service: ENT;  Laterality: Left;  . TONSILLECTOMY      Past Surgical History:  Procedure Laterality Date  . CARDIAC CATHETERIZATION N/A 01/27/2015   Procedure: Left Heart Cath;  Surgeon: Marcina MillardAlexander Paraschos, MD;  [facility];  Service: Cardiovascular;  Laterality: N/A;  . ETHMOIDECTOMY Left 06/17/2015   Procedure: ETHMOIDECTOMY REVISION and removal of polyps.;  Surgeon: Vernie MurdersPaul Juengel, MD;  [facility];  Service: ENT;  Laterality: Left;  . FRONTAL SINUS EXPLORATION Left 06/17/2015   Procedure: FRONTAL Sinusotomy with removal of polyps.;  Surgeon: Vernie MurdersPaul Juengel, MD;  [facility];  Service: ENT;  Laterality: Left;  . IMAGE GUIDED SINUS SURGERY N/A 06/17/2015   Procedure: IMAGE GUIDED  SINUS SURGERY;  Surgeon: Vernie Murders, MD;  Location: [facility];  Service: ENT;  Laterality: N/A;  GAVE DISK TO [name] PROPEL Gave 2nd disk to cece 10-24 kp  . JOINT REPLACEMENT    . MAXILLARY ANTROSTOMY Left 06/17/2015   Procedure: ENDOSCOPIC MAXILLARY ANTROSTOMY WITH REMOVAL  CONTENTS;  Surgeon: Vernie Murders, MD;  Location: [facility];  Service: ENT;  Laterality: Left;  . POLYPECTOMY Left 06/17/2015   Procedure: POLYPECTOMY NASAL;  Surgeon: Vernie Murders, MD;  Location: [facility];  Service: ENT;  Laterality: Left;  . TONSILLECTOMY      Current Outpatient Rx  . Order #: [id number] Class: Historical Med  . Order #: [id number] Class: Historical Med  . Order #: [id number] Class: Historical Med  . Order #: [id number] Class: Print  . Order #: [id number] Class: Print  . Order #: [id number] Class: Historical Med  . Order #: [id number] Class: Historical Med  . Order #: [id number] Class: Historical Med  . Order #: [id number] Class: Historical Med  . Order #: [id number] Class: Historical Med  . Order #: [id number] Class: Historical Med  . Order #: [id number] Class: Print  . Order #: [id number] Class: Historical Med  . Order #: [id number] Class: Print  . Order #: [id number] Class: Print  . Order #: [id number] Class: Historical Med  . Order #: [id number] Class: Historical Med  . Order #: [id number] Class: Historical Med  . Order #: [id number] Class: Historical Med  . Order #: [id number] Class: Historical Med  . Order #: [id number] Class: Historical Med    Allergies:  Patient has no known allergies.  Family History: History reviewed. No pertinent family history.  Social History: Social History  Substance Use Topics  . Smoking status: Current Every Day Smoker    Packs/day: 2.00    Years: 15.00    Types: Cigarettes  . Smokeless tobacco: Current User  . Alcohol use No     Review of Systems:   10 point review of systems was performed and was otherwise negative:  Constitutional: No fever Eyes: No visual disturbances ENT: No sore throat, ear pain Cardiac: No chest pain Respiratory: No shortness of breath, wheezing, or stridor Abdomen: Pain is primarily over the left lower quadrant. He denies any nausea or vomiting. He states he had loose stool with no melena or  hematochezia. Endocrine: No weight loss, No night sweats Extremities: No peripheral edema, cyanosis Skin: No rashes, easy bruising Neurologic: No focal weakness, trouble with speech or swollowing Urologic: No dysuria, Hematuria, or urinary frequency *  Physical Exam:  ED Triage Vitals  Enc Vitals Group     BP 09/11/16 1738 116/75     Pulse Rate 09/11/16 1738 98     Resp 09/11/16 1738 16     Temp 09/11/16 1738 97.7 F (36.5 C)     Temp Source 09/11/16 1738 Oral     SpO2 09/11/16 1738 94 %     Weight 09/11/16 1739 236 lb (107 kg)     Height 09/11/16 1739 5' 10.5" (1.791 m)     Head Circumference --      Peak Flow --      Pain Score 09/11/16 1744 5     Pain Loc --      Pain Edu? --      Excl. in GC? --     General: Awake , Alert , and Oriented times 3; GCS 15 Head: Normal cephalic , atraumatic Eyes: Pupils equal , round, reactive to light Nose/Throat: No nasal drainage, patent upper airway without erythema or exudate.  Neck: Supple, Full range of motion, No  anterior adenopathy or palpable thyroid masses Lungs: Clear to ascultation without wheezes , rhonchi, or rales Heart: Regular rate, regular rhythm without murmurs , gallops , or rubs Abdomen: Mild tenderness over the left lower quadrant with quiet bowel sounds auscultated throughout. No focal tenderness over McBurney's point. Negative Murphy's sign.       Extremities: 2 plus symmetric pulses. No edema, clubbing or cyanosis Neurologic: normal ambulation, Motor symmetric without deficits, sensory intact Skin: warm, dry, no rashes   Labs:   All laboratory work was reviewed including any pertinent negatives or positives listed below:  Labs Reviewed  COMPREHENSIVE METABOLIC PANEL - Abnormal; Notable for the following:       Result Value   Sodium 134 (*)    Chloride 96 (*)    All other components within normal limits  CBC - Abnormal; Notable for the following:    WBC 12.1 (*)    All other components within normal limits   URINALYSIS, COMPLETE (UACMP) WITH MICROSCOPIC - Abnormal; Notable for the following:    Color, Urine STRAW (*)    APPearance CLEAR (*)    Specific Gravity, Urine 1.004 (*)    All other components within normal limits  LIPASE, BLOOD   Laboratory work was reviewed and showed no clinically significant abnormalities.  Radiology: * "Ct Abdomen Pelvis W Contrast  Result Date: 09/11/2016 CLINICAL DATA:  Lower abdominal pain, diarrhea EXAM: CT ABDOMEN AND PELVIS WITH CONTRAST TECHNIQUE: Multidetector CT imaging of the abdomen and pelvis was performed using the standard protocol following bolus administration of intravenous contrast. CONTRAST:  ISOVUE-300 IOPAMIDOL (ISOVUE-300) INJECTION 61% COMPARISON:  KUB from 04/20/2014 FINDINGS: LOWER CHEST: Lung bases demonstrate atelectasis at the left lung base. Included heart size is normal. No pericardial effusion. HEPATOBILIARY: Liver and gallbladder are normal. PANCREAS: Normal. SPLEEN: Normal.  Tiny adjacent splenule. ADRENALS/URINARY TRACT: Kidneys are orthotopic, demonstrating symmetric enhancement. No nephrolithiasis, hydronephrosis or solid renal masses. The unopacified ureters are normal in course and caliber. Delayed imaging through the kidneys demonstrates symmetric prompt contrast excretion within the proximal urinary collecting system. Urinary bladder is partially distended and unremarkable. Normal adrenal glands. STOMACH/BOWEL: The stomach, small and large bowel are normal in course without bowel obstruction. Mild pericolonic inflammation for transmural thickening along the descending and proximal sigmoid colon consistent with colitis. VASCULAR/LYMPHATIC: Aortic atherosclerosis. No aneurysm. IVC filter in place spanning L2 through mid L3. No lymphadenopathy by CT size criteria. REPRODUCTIVE: Normal. OTHER: No intraperitoneal free fluid or free air. MUSCULOSKELETAL: Nonacute. Left hip arthroplasty is partially visualized. Joint space narrowing of the  right hip with subchondral sclerosis of the weight-bearing portion of the right femoral head consistent with a chronic AVN. IMPRESSION: Colitis of the descending and proximal sigmoid colon. 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 Electronically Signed   By: Tollie Eth M.D.   On: 09/11/2016 20:11  "  I personally reviewed the radiologic studies     ED Course: Patient appears to have a small amount of colitis (region. It does not appear to be ulcerative colitis or Crohn's disease at this time. Patient was advised to require follow-up and I prescribed him Flagyl along with Bentyl for pain. He is been advised to return here if he develops any persistent vomiting, fever, increased pain, or large amount of bloody diarrhea and advised to continue with his MiraLAX at home     Assessment: * Colitis     Plan:  Outpatient " New Prescriptions   DICYCLOMINE (BENTYL) 20 MG TABLET  Take 1 tablet (20 mg total) by mouth 3 (three) times daily as needed for spasms.   METRONIDAZOLE (FLAGYL) 500 MG TABLET    Take 1 tablet (500 mg total) by mouth 2 (two) times daily.  " Patient was advised to return immediately if condition worsens. Patient was advised to follow up with their primary care physician or other specialized physicians involved in their outpatient care. The patient and/or family member/power of attorney had laboratory results reviewed at the bedside. All questions and concerns were addressed and appropriate discharge instructions were distributed by the nursing staff.             Jennye Moccasin, MD 09/11/16 2128

## 2016-10-03 ENCOUNTER — Ambulatory Visit (INDEPENDENT_AMBULATORY_CARE_PROVIDER_SITE_OTHER): Payer: Medicare Other | Admitting: Gastroenterology

## 2016-10-03 ENCOUNTER — Other Ambulatory Visit: Payer: Self-pay

## 2016-10-03 ENCOUNTER — Encounter: Payer: Self-pay | Admitting: Gastroenterology

## 2016-10-03 VITALS — BP 132/67 | HR 102 | Temp 97.7°F | Ht 70.0 in | Wt 234.0 lb

## 2016-10-03 DIAGNOSIS — R194 Change in bowel habit: Secondary | ICD-10-CM

## 2016-10-03 DIAGNOSIS — R634 Abnormal weight loss: Secondary | ICD-10-CM | POA: Diagnosis not present

## 2016-10-03 DIAGNOSIS — K529 Noninfective gastroenteritis and colitis, unspecified: Secondary | ICD-10-CM

## 2016-10-03 DIAGNOSIS — K921 Melena: Secondary | ICD-10-CM
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
## 2016-10-05 ENCOUNTER — Encounter (INDEPENDENT_AMBULATORY_CARE_PROVIDER_SITE_OTHER): Payer: Medicare Other | Admitting: Vascular Surgery

## 2016-10-05 DIAGNOSIS — M25552 Pain in left hip: Secondary | ICD-10-CM | POA: Insufficient documentation

## 2016-10-09 ENCOUNTER — Encounter (INDEPENDENT_AMBULATORY_CARE_PROVIDER_SITE_OTHER): Payer: Medicare Other | Admitting: Vascular Surgery

## 2016-10-10 ENCOUNTER — Ambulatory Visit: Payer: Medicare Other | Admitting: Gastroenterology

## 2016-10-23 ENCOUNTER — Encounter (INDEPENDENT_AMBULATORY_CARE_PROVIDER_SITE_OTHER): Payer: Self-pay | Admitting: Vascular Surgery

## 2016-10-23 ENCOUNTER — Encounter (INDEPENDENT_AMBULATORY_CARE_PROVIDER_SITE_OTHER): Payer: Self-pay

## 2016-10-23 ENCOUNTER — Ambulatory Visit (INDEPENDENT_AMBULATORY_CARE_PROVIDER_SITE_OTHER): Payer: Medicare Other | Admitting: Vascular Surgery

## 2016-10-23 VITALS — BP 123/75 | HR 106 | Resp 17 | Ht 70.5 in | Wt 237.0 lb

## 2016-10-23 DIAGNOSIS — K219 Gastro-esophageal reflux disease without esophagitis: Secondary | ICD-10-CM

## 2016-10-23 DIAGNOSIS — M79606 Pain in leg, unspecified: Secondary | ICD-10-CM | POA: Insufficient documentation

## 2016-10-23 DIAGNOSIS — E782 Mixed hyperlipidemia: Secondary | ICD-10-CM

## 2016-10-23 DIAGNOSIS — M79605 Pain in left leg: Secondary | ICD-10-CM

## 2016-10-23 DIAGNOSIS — G8929 Other chronic pain: Secondary | ICD-10-CM | POA: Diagnosis not present

## 2016-10-23 NOTE — Progress Notes (Signed)
MRN : [id number]  Richard Davis is a 41 y.o. ([birth year]) male who presents with chief complaint of  Chief Complaint  Patient presents with  . New Patient (Initial Visit)  .  History of Present Illness: The patient is seen for evaluation of painful left lower extremities. Patient notes the pain is variable and not always associated with activity.  The pain is somewhat consistent day to day occurring on most days. The patient notes the pain also occurs with standing and routinely seems worse as the day wears on. The pain has been progressive over the past several years. The patient states these symptoms are causing  a profound negative impact on quality of life and daily activities.  The patient denies rest pain or dangling of an extremity off the side of the bed during the night for relief. No open wounds or sores at this time. No history of DVT or phlebitis. No prior interventions or surgeries.  There is a  history of back problems and DJD of the lumbar and sacral spine.    Current Meds  Medication Sig  . fluticasone (FLONASE) 50 MCG/ACT nasal spray Place 2 sprays into both nostrils daily.  Marland Kitchen gabapentin (NEURONTIN) 300 MG capsule Take 300 mg by mouth 3 (three) times daily.  Marland Kitchen lisinopril-hydrochlorothiazide (PRINZIDE,ZESTORETIC) 20-12.5 MG tablet Take 1 tablet by mouth daily.  Marland Kitchen METHADONE HCL PO Take 165 mg by mouth daily.  Marland Kitchen Phenylephrine-APAP-Guaifenesin (MUCINEX FAST-MAX COLD & SINUS) 5-325-200 MG TABS Take by mouth as needed.  Marland Kitchen QUEtiapine (SEROQUEL) 300 MG tablet Take 300 mg by mouth at bedtime.  . simvastatin (ZOCOR) 40 MG tablet Take 40 mg by mouth daily.  . traZODone (DESYREL) 100 MG tablet Take 100 mg by mouth at bedtime.    Past Medical History:  Diagnosis Date  . Arthritis    hands, foot, arm  . Depression   . GERD (gastroesophageal reflux disease)   . Hypercholesteremia   . Hypertension     Past Surgical History:  Procedure Laterality Date  . CARDIAC  CATHETERIZATION N/A 01/27/2015   Procedure: Left Heart Cath;  Surgeon: Marcina Millard, MD;  Location: [facility] CV LAB;  Service: Cardiovascular;  Laterality: N/A;  . ETHMOIDECTOMY Left 06/17/2015   Procedure: ETHMOIDECTOMY REVISION and removal of polyps.;  Surgeon: Vernie Murders, MD;  Location: [facility] SURGERY CNTR;  Service: ENT;  Laterality: Left;  . FRONTAL SINUS EXPLORATION Left 06/17/2015   Procedure: FRONTAL Sinusotomy with removal of polyps.;  Surgeon: Vernie Murders, MD;  Location: [facility];  Service: ENT;  Laterality: Left;  . IMAGE GUIDED SINUS SURGERY N/A 06/17/2015   Procedure: IMAGE GUIDED SINUS SURGERY;  Surgeon: Vernie Murders, MD;  Location: [facility] SURGERY CNTR;  Service: ENT;  Laterality: N/A;  GAVE DISK TO [name] PROPEL Gave 2nd disk to cece 10-24 kp  . JOINT REPLACEMENT    . MAXILLARY ANTROSTOMY Left 06/17/2015   Procedure: ENDOSCOPIC MAXILLARY ANTROSTOMY WITH REMOVAL CONTENTS;  Surgeon: Vernie Murders, MD;  Location: [facility] SURGERY CN[facility];  Service: ENT;  Laterality: Left;  . POLYPECTOMY Left 06/17/2015   Procedure: POLYPECTOMY NASAL;  Surgeon: Vernie Murders, MD;  Location: [facility] SUR[facility];  Service: ENT;  Laterality: Left;  . TONSILLECTOMY      Social History Social History  Substance Use Topics  . Smoking status: Current Every Day Smoker    Packs/day: 2.00    Years: 15.00    Types: Cigarettes  . Smokeless tobacco: Never Used  . Alcohol use No  Family History Family History  Problem Relation Age of Onset  . Hypercholesterolemia Mother   . Crohn's disease Father   No family history of bleeding/clotting disorders, porphyria or autoimmune disease  No Known Allergies   REVIEW OF SYSTEMS (Negative unless checked)  Constitutional: [] Weight loss  [] Fever  [] Chills Cardiac: [] Chest pain   [] Chest pressure   [] Palpitations   [] Shortness of breath when laying flat   [] Shortness of breath with exertion. Vascular:  [x] Pain in legs with walking    [x] Pain in legs with standing  [] History of DVT   [] Phlebitis   [x] Swelling in legs   [] Varicose veins   [] Non-healing ulcers Pulmonary:   [] Uses home oxygen   [] Productive cough   [] Hemoptysis   [] Wheeze  [] COPD   [] Asthma Neurologic:  [] Dizziness   [] Seizures   [] History of stroke   [] History of TIA  [] Aphasia   [] Vissual changes   [] Weakness or numbness in arm   [] Weakness or numbness in leg Musculoskeletal:   [] Joint swelling   [x] Joint pain   [x] Low back pain Hematologic:  [] Easy bruising  [] Easy bleeding   [] Hypercoagulable state   [] Anemic Gastrointestinal:  [] Diarrhea   [] Vomiting  [] Gastroesophageal reflux/heartburn   [] Difficulty swallowing. Genitourinary:  [] Chronic kidney disease   [] Difficult urination  [] Frequent urination   [] Blood in urine Skin:  [] Rashes   [] Ulcers  Psychological:  [] History of anxiety   []  History of major depression.  Physical Examination  Vitals:   10/23/16 0924  BP: 123/75  Pulse: (!) 106  Resp: 17  Weight: 237 lb (107.5 kg)  Height: 5' 10.5" (1.791 m)   Body mass index is 33.53 kg/m. Gen: WD/WN, NAD Head: [place]/AT, No temporalis wasting.  Ear/Nose/Throat: Hearing grossly intact, nares w/o erythema or drainage, poor dentition Eyes: PER, EOMI, sclera nonicteric.  Neck: Supple, no masses.  No bruit or JVD.  Pulmonary:  Good air movement, clear to auscultation bilaterally, no use of accessory muscles.  Cardiac: RRR, normal S1, S2, no Murmurs. Vascular: Feet are pink with 1-2 second cap refill but cool to the touch Vessel Right Left  Radial Palpable Palpable  Ulnar Palpable Palpable  Brachial Palpable Palpable  Carotid Palpable Palpable  Femoral Palpable Palpable  Popliteal Trace Palpable Trace Palpable  PT Not Palpable Not Palpable  DP Not Palpable Not Palpable   Gastrointestinal: soft, non-distended. No guarding/no peritoneal signs.  Musculoskeletal: M/S 5/5 throughout.  No deformity or atrophy.  Neurologic: CN 2-12 intact. Pain and light  touch intact in extremities.  Symmetrical.  Speech is fluent. Motor exam as listed above. Psychiatric: Judgment intact, Mood & affect appropriate for pt's clinical situation. Dermatologic: No rashes or ulcers noted.  No changes consistent with cellulitis. Lymph : No Cervical lymphadenopathy, no lichenification or skin changes of chronic lymphedema.  CBC Lab Results  Component Value Date   WBC 12.1 (H) 09/11/2016   HGB 14.3 09/11/2016   HCT 40.7 09/11/2016   MCV 90.1 09/11/2016   PLT 283 09/11/2016    BMET    Component Value Date/Time   NA 134 (L) 09/11/2016 1743   NA 138 04/20/2014 1424   K 3.7 09/11/2016 1743   K 4.3 04/20/2014 1424   CL 96 (L) 09/11/2016 1743   CL 102 04/20/2014 1424   CO2 29 09/11/2016 1743   CO2 29 04/20/2014 1424   GLUCOSE 90 09/11/2016 1743   GLUCOSE 101 (H) 04/20/2014 1424   BUN 7 09/11/2016 1743   BUN 4 (L) 04/20/2014 1424   CREATININE  1.10 09/11/2016 1743   CREATININE 0.94 04/20/2014 1424   CALCIUM 9.0 09/11/2016 1743   CALCIUM 8.5 04/20/2014 1424   GFRNONAA >60 09/11/2016 1743   GFRNONAA >60 04/20/2014 1424   GFRAA >60 09/11/2016 1743   GFRAA >60 04/20/2014 1424   CrCl cannot be calculated (Patient's most recent lab result is older than the maximum 21 days allowed.).  COAG No results found for: INR, PROTIME  Radiology No results found.  Assessment/Plan 1. Pain of left lower extremity  Recommend:  The patient has atypical pain symptoms for pure atherosclerotic disease. However, on physical exam there is evidence of mixed venous and arterial disease, given the diminished pulses and the edema associated with venous changes of the legs.  Noninvasive studies including ABI's and venous ultrasound of the legs will be obtained and the patient will follow up with me to review these studies.  The patient should continue walking and begin a more formal exercise program. The patient should continue his antiplatelet therapy and aggressive  treatment of the lipid abnormalities.  The patient should begin wearing graduated compression socks 15-20 mmHg strength to control edema.   2. Mixed hyperlipidemia Continue statin as ordered and reviewed, no changes at this time   3. Other chronic pain Continued workup and management of her chronic pain and low back problems as already arranged  4. Gastroesophageal reflux disease without esophagitis Continue PPI as already ordered, these medications have been reviewed and there are no changes at this time.     Levora Dredge, MD  10/23/2016 9:19 PM

## 2016-11-23 ENCOUNTER — Ambulatory Visit (INDEPENDENT_AMBULATORY_CARE_PROVIDER_SITE_OTHER): Payer: Medicare Other | Admitting: Vascular Surgery

## 2016-11-23 ENCOUNTER — Encounter (INDEPENDENT_AMBULATORY_CARE_PROVIDER_SITE_OTHER): Payer: Medicare Other

## 2016-12-07 DIAGNOSIS — M8700 Idiopathic aseptic necrosis of unspecified bone: Secondary | ICD-10-CM | POA: Insufficient documentation

## 2017-04-14 ENCOUNTER — Emergency Department
Admission: EM | Admit: 2017-04-14 | Discharge: 2017-04-14 | Disposition: A | Discharge status: Home / Self Care | Payer: Medicare Other | Attending: Emergency Medicine | Admitting: Emergency Medicine

## 2017-04-14 ENCOUNTER — Emergency Department: Payer: Medicare Other

## 2017-04-14 DIAGNOSIS — F17210 Nicotine dependence, cigarettes, uncomplicated: Secondary | ICD-10-CM | POA: Diagnosis not present

## 2017-04-14 DIAGNOSIS — Z7982 Long term (current) use of aspirin: Secondary | ICD-10-CM | POA: Insufficient documentation

## 2017-04-14 DIAGNOSIS — R1011 Right upper quadrant pain: Secondary | ICD-10-CM | POA: Insufficient documentation

## 2017-04-14 DIAGNOSIS — I10 Essential (primary) hypertension: Secondary | ICD-10-CM | POA: Insufficient documentation

## 2017-04-14 DIAGNOSIS — Z79899 Other long term (current) drug therapy: Secondary | ICD-10-CM | POA: Insufficient documentation

## 2017-04-14 LAB — COMPREHENSIVE METABOLIC PANEL
ALBUMIN: 3.9 g/dL (ref 3.5–5.0)
ALK PHOS: 73 U/L (ref 38–126)
ALT: 47 U/L (ref 17–63)
AST: 39 U/L (ref 15–41)
Anion gap: 9 (ref 5–15)
BILIRUBIN TOTAL: 0.5 mg/dL (ref 0.3–1.2)
BUN: 7 mg/dL (ref 6–20)
CO2: 32 mmol/L (ref 22–32)
CREATININE: 0.87 mg/dL (ref 0.61–1.24)
Calcium: 9 mg/dL (ref 8.9–10.3)
Chloride: 89 mmol/L — ABNORMAL LOW (ref 101–111)
GFR calc Af Amer: >60 mL/min (ref >60)
GLUCOSE: 149 mg/dL — AB (ref 65–99)
POTASSIUM: 3.7 mmol/L (ref 3.5–5.1)
Sodium: 130 mmol/L — ABNORMAL LOW (ref 135–145)
TOTAL PROTEIN: 6.7 g/dL (ref 6.5–8.1)

## 2017-04-14 LAB — CBC
HCT: 37.2 % — ABNORMAL LOW (ref 40.0–52.0)
HEMOGLOBIN: 13.2 g/dL (ref 13.0–18.0)
MCH: 31.9 pg (ref 26.0–34.0)
MCHC: 35.4 g/dL (ref 32.0–36.0)
MCV: 90.3 fL (ref 80.0–100.0)
PLATELETS: 215 10*3/uL (ref 150–440)
RBC: 4.13 MIL/uL — ABNORMAL LOW (ref 4.40–5.90)
RDW: 13.5 % (ref 11.5–14.5)
WBC: 10.8 10*3/uL — ABNORMAL HIGH (ref 3.8–10.6)

## 2017-04-14 LAB — LACTIC ACID, PLASMA: Lactic Acid, Venous: 1.1 mmol/L (ref 0.5–1.9)

## 2017-04-14 LAB — LIPASE, BLOOD: Lipase: 22 U/L (ref 11–51)

## 2017-04-14 MED ORDER — IOPAMIDOL (ISOVUE-300) INJECTION 61%
30.0000 mL | Freq: Once | INTRAVENOUS | Status: AC | PRN
  Administered 2017-04-14: 30 mL via ORAL

## 2017-04-14 MED ORDER — IBUPROFEN 600 MG PO TABS
600.0000 mg | ORAL_TABLET | Freq: Once | ORAL | Status: AC
  Administered 2017-04-14: 600 mg via ORAL
  Filled 2017-04-14: qty 1

## 2017-04-14 MED ORDER — FAMOTIDINE 20 MG PO TABS
20.0000 mg | ORAL_TABLET | Freq: Two times a day (BID) | ORAL | 1 refills | Status: DC
Start: 2017-04-14 — End: 2018-01-18

## 2017-04-14 MED ORDER — IOPAMIDOL (ISOVUE-300) INJECTION 61%
125.0000 mL | Freq: Once | INTRAVENOUS | Status: AC | PRN
  Administered 2017-04-14: 125 mL via INTRAVENOUS

## 2017-04-14 NOTE — ED Provider Notes (Signed)
[facility] Emergency Department Provider Note ____________________________________________   First MD Initiated Contact with Patient 04/14/17 1250     (approximate)  I have reviewed the triage vital signs and the nursing notes.   HISTORY  Chief Complaint Abdominal Pain    HPI Richard Davis is a 41 y.o. male Who presents with right upper quadrant abdominal pain (not left as stated in triage), worse after eating and particularly after drinking fluids, associated with nausea but no vomiting. No associated fever. Patient denies lower abdominal pain, diarrhea, or urinary symptoms. No prior history of this pain.  No associated chest pain or difficulty breathing. No cough.  Past Medical History:  Diagnosis Date  . Arthritis    hands, foot, arm  . Depression   . GERD (gastroesophageal reflux disease)   . Hypercholesteremia   . Hypertension     Patient Active Problem List   Diagnosis Date Noted  . Leg pain 10/23/2016  . Surgical complication involving left eye associated with non-ophthalmic procedure 06/17/2015  . Hx of cardiac catheterization 02/03/2015  . 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 Chest pain with high risk for cardiac etiology 01/27/2015  . Abnormal nuclear stress test 01/27/2015  . Chronic pain 01/27/2015  . Acid reflux 01/27/2015  . Hyperlipidemia 01/27/2015  . BP (high blood pressure) 01/27/2015  . Joint pain 01/27/2015  . Sinusitis 01/27/2015  . Current tobacco use 01/27/2015  . Breathlessness on exertion 12/31/2014  . Breast development in males 02/10/2013  . Schizoaffective disorder (HCC) 06/04/2012    Past Surgical History:  Procedure Laterality Date  . CARDIAC CATHETERIZATION N/A 01/27/2015   Procedure: Left Heart Cath;  Surgeon: Marcina MillardAlexander Paraschos, MD;  [facility];  Service: Cardiovascular;  Laterality: N/A;  . ETHMOIDECTOMY Left 06/17/2015   Procedure: ETHMOIDECTOMY REVISION and  removal of polyps.;  Surgeon: Vernie MurdersPaul Juengel, MD;  [facility];  Service: ENT;  Laterality: Left;  . FRONTAL SINUS EXPLORATION Left 06/17/2015   Procedure: FRONTAL Sinusotomy with removal of polyps.;  Surgeon: Vernie MurdersPaul Juengel, MD;  [facility];  Service: ENT;  Laterality: Left;  . IMAGE GUIDED SINUS SURGERY N/A 06/17/2015   Procedure: IMAGE GUIDED SINUS SURGERY;  Surgeon: Vernie MurdersPaul Juengel, MD;  [facility];  Service: ENT;  Laterality: N/A;  GAVE DISK TO [name] PROPEL Gave 2nd disk to cece 10-24 kp  . JOINT REPLACEMENT    . MAXILLARY ANTROSTOMY Left 06/17/2015   Procedure: ENDOSCOPIC MAXILLARY ANTROSTOMY WITH REMOVAL CONTENTS;  Surgeon: Vernie MurdersPaul Juengel, MD;  [facility];  Service: ENT;  Laterality: Left;  . POLYPECTOMY Left 06/17/2015   Procedure: POLYPECTOMY NASAL;  Surgeon: Vernie MurdersPaul Juengel, MD;  [facility];  Service: ENT;  Laterality: Left;  . TONSILLECTOMY      Prior to Admission medications   Medication Sig Start Date End Date Taking? Authorizing Provider  albuterol (PROVENTIL HFA;VENTOLIN HFA) 108 (90 BASE) MCG/ACT inhaler Inhale 2 puffs into the lungs every 6 (six) hours as needed for wheezing or shortness of breath.    [provider]  amitriptyline (ELAVIL) 100 MG tablet Take 100 mg by mouth at bedtime.    [provider]  aspirin EC 81 MG tablet Take 81 mg by mouth daily.    [provider]  bacitracin-polymyxin b (POLYSPORIN) ophthalmic ointment Place 1 application into the left eye every 4 (four) hours while awake. apply to left eyelid every 4 hours while awake Patient not taking: Reported  on 03/20/2016 06/17/15   Lockie Mola, MD  brompheniramine-pseudoephedrine-DM 30-2-10 MG/5ML syrup Take 5 mLs by mouth 4 (four) times daily as needed. Patient not taking: Reported on 03/20/2016 01/22/16   Joni Reining, PA-C  cabergoline (DOSTINEX) 0.5 MG tablet Take 0.25 mg by mouth 2 (two)  times a week.    [provider]  dicyclomine (BENTYL) 20 MG tablet Take 1 tablet (20 mg total) by mouth 3 (three) times daily as needed for spasms. Patient not taking: Reported on 10/03/2016 09/11/16   Jennye Moccasin, MD  fluPHENAZine (PROLIXIN) 5 MG tablet Take 5 mg by mouth daily.    [provider]  fluticasone (FLONASE) 50 MCG/ACT nasal spray Place 2 sprays into both nostrils daily.    [provider]  gabapentin (NEURONTIN) 300 MG capsule Take 300 mg by mouth 3 (three) times daily.    [provider]  haloperidol (HALDOL) 10 MG tablet Take 10 mg by mouth at bedtime.    [provider]  isosorbide mononitrate (IMDUR) 30 MG 24 hr tablet Take 30 mg by mouth daily.    [provider]  ketoconazole (NIZORAL) 2 % cream Apply 1 application topically 2 (two) times daily. Patient not taking: Reported on 03/20/2016 02/23/15   Menshew, Charlesetta Ivory, PA-C  lisinopril-hydrochlorothiazide (PRINZIDE,ZESTORETIC) 20-12.5 MG tablet Take 1 tablet by mouth daily.    [provider]  meclizine (ANTIVERT) 32 MG tablet Take 1 tablet (32 mg total) by mouth 3 (three) times daily as needed. Patient not taking: Reported on 03/20/2016 01/17/16   Menshew, Charlesetta Ivory, PA-C  meloxicam (MOBIC) 15 MG tablet Take 1 tablet (15 mg total) by mouth daily. Patient not taking: Reported on 03/20/2016 08/29/15   Kem Boroughs B, FNP  METHADONE HCL PO Take 165 mg by mouth daily.    [provider]  methazolamide (NEPTAZANE) 50 MG tablet Take 175 mg by mouth daily.    [provider]  Phenylephrine-APAP-Guaifenesin (MUCINEX FAST-MAX COLD & SINUS) 5-325-200 MG TABS Take by mouth as needed.    [provider]  QUEtiapine (SEROQUEL) 300 MG tablet Take 300 mg by mouth at bedtime.    [provider]  simvastatin (ZOCOR) 40 MG tablet Take 40 mg by mouth daily.    [provider]  traZODone (DESYREL) 100 MG tablet Take 100 mg by mouth  at bedtime.    [provider]    Allergies Patient has no known allergies.  Family History  Problem Relation Age of Onset  . Hypercholesterolemia Mother   . Crohn's disease Father     Social History Social History  Substance Use Topics  . Smoking status: Current Every Day Smoker    Packs/day: 2.00    Years: 15.00    Types: Cigarettes  . Smokeless tobacco: Never Used  . Alcohol use No    Review of Systems  Constitutional: No fever/chills Eyes: No visual changes. ENT: No sore throat. Cardiovascular: Denies chest pain. Respiratory: Denies shortness of breath. Gastrointestinal: Positive for abdominal pain Genitourinary: Negative for dysuria.  Musculoskeletal: Negative for back pain. Skin: Negative for rash. Neurological: Negative for headaches, focal weakness or numbness.   ____________________________________________   PHYSICAL EXAM:  VITAL SIGNS: ED Triage Vitals  Enc Vitals Group     BP 04/14/17 1146 129/80     Pulse Rate 04/14/17 1143 (!) 105     Resp 04/14/17 1143 16     Temp 04/14/17 1143 98.9 F (37.2 C)  Temp Source 04/14/17 1143 Oral     SpO2 04/14/17 1143 96 %     Weight 04/14/17 1143 260 lb (117.9 kg)     Height 04/14/17 1143  (1.778 m)     Head Circumference --      Peak Flow --      Pain Score --      Pain Loc --      Pain Edu? --      Excl. in GC? --     Constitutional: Alert and oriented. Well appearing and in no acute distress. Eyes: Conjunctivae are normal. No scleral icterus. Head: Atraumatic. Nose: No congestion/rhinnorhea. Mouth/Throat: Mucous membranes are moist.   Neck: Normal range of motion.  Cardiovascular: Good peripheral circulation. Respiratory: Normal respiratory effort.   Gastrointestinal: Soft with mild right upper quadrant and subcostal tenderness. Genitourinary: No CVA tenderness. Musculoskeletal:  Extremities warm and well perfused.  Neurologic:  Normal speech and language. No gross focal  neurologic deficits are appreciated.  Skin:  Skin is warm and dry. No rash noted. Psychiatric: Mood and affect are normal. Speech and behavior are normal.  ____________________________________________   LABS (all labs ordered are listed, but only abnormal results are displayed)  Labs Reviewed  COMPREHENSIVE METABOLIC PANEL - Abnormal; Notable for the following:       Result Value   Sodium 130 (*)    Chloride 89 (*)    Glucose, Bld 149 (*)    All other components within normal limits  CBC - Abnormal; Notable for the following:    WBC 10.8 (*)    RBC 4.13 (*)    HCT 37.2 (*)    All other components within normal limits  LIPASE, BLOOD  URINALYSIS, COMPLETE (UACMP) WITH MICROSCOPIC   ____________________________________________  EKG   ____________________________________________  RADIOLOGY  Negative right upper quadrant [place].    ____________________________________________   PROCEDURES  Procedure(s) performed: No    Critical Care performed: No ____________________________________________   INITIAL IMPRESSION / ASSESSMENT AND PLAN / ED COURSE  Pertinent labs & imaging results that were available during my care of the patient were reviewed by me and considered in my medical decision making (see chart for details).  41 year old male presents 1 week of intermittent right upper quadrant pain worse after eating and drinking, with associated nausea.  Vital signs are normal except for borderline tachycardia, patient is slightly uncomfortable but generally well-appearing, and there is mild tenderness in the right upper quadrant. Differential includes cholelithiasis and biliary colic, acute cholecystitis (less likely due to lack of fever and duration of symptoms), pancreatitis, gastritis, PUD. Plan: Basic and hepatobiliary labs, right upper quadrant ultrasound, analgesia, and reassess. If ultrasound negative consider proceeding to CT for additional workup.      ----------------------------------------- 3:15 PM on 04/14/2017 -----------------------------------------  RUQ sono is negative. Patient has somewhat improved but persistent pain so as per initial plan Will obtain CT. If CT negative likely discharge home.  ----------------------------------------- 4:10 PM on 04/14/2017 -----------------------------------------  Patient signed out to Dr. Don Perking who will follow-up on the CT and reassess.  ____________________________________________   FINAL CLINICAL IMPRESSION(S) / ED DIAGNOSES  Final diagnoses:  Right upper quadrant pain      NEW MEDICATIONS STARTED DURING THIS VISIT:  New Prescriptions   No medications on file     Note:  This document was prepared using Dragon voice recognition software and may include unintentional dictation errors.    Dionne Bucy, MD 04/14/17 [id number]

## 2017-04-14 NOTE — Discharge Instructions (Signed)

## 2017-04-14 NOTE — ED Notes (Signed)
Patient transported to Ultrasound 

## 2017-04-14 NOTE — ED Provider Notes (Signed)
----------------------------------------- 3:45 PM on 04/14/2017 -----------------------------------------   Blood pressure 129/80, pulse (!) 105, temperature 98.9 F (37.2 C), temperature source Oral, resp. rate 16, height 5\' 10"  (1.778 m), weight 117.9 kg (260 lb), SpO2 96 %.  Assuming care from Dr. Marisa SeverinSiadecki of [name] is a 41 y.o. male with a chief complaint of Abdominal Pain .    Please refer to H&P by previous MD for further details.  The current plan of care is to f/u results of CT a/p and reassess.     _________________________ 5:30 PM on 04/14/2017 -----------------------------------------  CT Abdomen Pelvis W Contrast (Final result)  Result time 04/14/17 16:58:03  Final result by Florencia ReasonsEntrikin, Daniel W, MD (04/14/17 16:58:03)           Narrative:   CLINICAL DATA: 41 year old male with history of left-sided abdominal pain for 1 week. Pain worsens with an eating and drinking.  EXAM: CT ABDOMEN AND PELVIS WITH CONTRAST  TECHNIQUE: Multidetector CT imaging of the abdomen and pelvis was performed using the standard protocol following bolus administration of intravenous contrast.  CONTRAST: 125mL ISOVUE-300 IOPAMIDOL (ISOVUE-300) INJECTION 61%  COMPARISON: CT the abdomen and pelvis 09/11/2016.  FINDINGS: Lower chest: Mild scarring in the left lung base, similar to the prior examination.  Hepatobiliary: No cystic or solid hepatic lesions. No intra or extrahepatic biliary ductal dilatation. Gallbladder is moderately distended, but otherwise unremarkable in appearance.  Pancreas: No pancreatic mass. No pancreatic ductal dilatation. No pancreatic or peripancreatic fluid or inflammatory changes.  Spleen: Unremarkable.  Adrenals/Urinary Tract: Bilateral kidneys and bilateral adrenal glands are normal in appearance. No hydroureteronephrosis. Urinary bladder is normal in appearance.  Stomach/Bowel: The appearance of the stomach is normal. No pathologic  dilatation of small bowel or colon. Normal appendix.  Vascular/Lymphatic: Aortic atherosclerosis, without evidence of aneurysm or dissection in the abdominal or pelvic vasculature. IVC filter noted with tip terminating shortly before the level of the renal veins. No lymphadenopathy identified in the abdomen or pelvis.  Reproductive: Prostate gland and seminal vesicles are unremarkable in appearance.  Other: No significant volume of ascites. No pneumoperitoneum.  Musculoskeletal: There are no aggressive appearing lytic or blastic lesions noted in the visualized portions of the skeleton. Status post left hip arthroplasty.  IMPRESSION: 1. No acute findings are noted in the abdomen or pelvis to account for the patient's symptoms. 2. Normal appendix. 3. Aortic atherosclerosis. 4. Additional incidental findings, as above. Aortic Atherosclerosis (ICD10-I70.0).   Electronically Signed By: Trudie Reedaniel Entrikin M.D. On: 04/14/2017 16:58             Patient's pain is resolved.Patient tells me that over the course of the last week his been having upper abdominal pain that he describes as cramping after he eats or drinks fluids. He denies a history of peptic ulcer disease or gastritis. He does endorse taking Aleve occasionally. He denies drinking alcohol. He reports that the pain is intermittent and will last about an hour after he ate and then will resolve. At this time he has no pain. He denies melena, hematemesis, vomiting, nausea, fever or chills.  Repeat abdominal exam showing no tenderness at this time. A CT scan was done with no acute findings other than significant atherosclerotic disease. With his pain being postprandial and the results of the CT that raises suspicion for possible mesenteric ischemia. Therefore I will order a lactic and I recommended a vascular consultation at this time for possible admission. Patient tells me that he has a lot of animals at home  and has been here  for several hours, he no longer has any pain at this time and he wishes to go home. He is willing to return if the lactic acid is elevated. I will send a referral for patient to see vascular surgery at this time. If his lactic is negative he will follow-up as an outpatient. I will discuss him with the vascular team for close follow-up. I encouraged that he returns to the emergency room for admission and further evaluation if the pain returns this way he'll be allowed to make appropriate arrangements for his animals. I also recommended follow up with GI in case his pain is from PUD/ gastritis. I will start him on Pepcid and recommended against NSAIDS. I explained to the patient as the risks of leaving the hospital including worsening ischemia which may result in bowel necrosis, significant surgery, morbidity and mortality. Patient understands and will return if the symptoms recur.    Don Perking, [place], MD 04/18/17 1520

## 2017-04-14 NOTE — ED Triage Notes (Signed)
Pt came to ED via pov c/o left sided abdominal pain for 1 week. Denies n/v/d.Pt reports worsening pain with eating and drinking. Denies chest pain or sob.

## 2017-04-19 ENCOUNTER — Ambulatory Visit (INDEPENDENT_AMBULATORY_CARE_PROVIDER_SITE_OTHER): Payer: Medicare Other | Admitting: Vascular Surgery

## 2017-04-19 ENCOUNTER — Ambulatory Visit: Payer: Medicare Other | Admitting: Gastroenterology

## 2017-05-01 ENCOUNTER — Other Ambulatory Visit: Payer: Self-pay

## 2017-05-01 ENCOUNTER — Ambulatory Visit: Payer: Medicare Other | Admitting: Gastroenterology

## 2017-05-01 ENCOUNTER — Encounter: Payer: Self-pay | Admitting: Gastroenterology

## 2017-10-01 ENCOUNTER — Emergency Department
Admission: EM | Admit: 2017-10-01 | Discharge: 2017-10-01 | Disposition: A | Discharge status: Home / Self Care | Payer: Medicare Other | Attending: Emergency Medicine | Admitting: Emergency Medicine

## 2017-10-01 ENCOUNTER — Other Ambulatory Visit: Payer: Self-pay

## 2017-10-01 ENCOUNTER — Emergency Department: Payer: Medicare Other

## 2017-10-01 ENCOUNTER — Encounter: Payer: Self-pay | Admitting: Emergency Medicine

## 2017-10-01 DIAGNOSIS — Z7982 Long term (current) use of aspirin: Secondary | ICD-10-CM | POA: Insufficient documentation

## 2017-10-01 DIAGNOSIS — W010XXA Fall on same level from slipping, tripping and stumbling without subsequent striking against object, initial encounter: Secondary | ICD-10-CM | POA: Diagnosis not present

## 2017-10-01 DIAGNOSIS — M533 Sacrococcygeal disorders, not elsewhere classified: Secondary | ICD-10-CM | POA: Diagnosis not present

## 2017-10-01 DIAGNOSIS — F17210 Nicotine dependence, cigarettes, uncomplicated: Secondary | ICD-10-CM | POA: Insufficient documentation

## 2017-10-01 DIAGNOSIS — I10 Essential (primary) hypertension: Secondary | ICD-10-CM | POA: Insufficient documentation

## 2017-10-01 DIAGNOSIS — Y939 Activity, unspecified: Secondary | ICD-10-CM | POA: Diagnosis not present

## 2017-10-01 DIAGNOSIS — Y929 Unspecified place or not applicable: Secondary | ICD-10-CM | POA: Diagnosis not present

## 2017-10-01 DIAGNOSIS — M545 Low back pain: Secondary | ICD-10-CM | POA: Insufficient documentation

## 2017-10-01 DIAGNOSIS — S61432A Puncture wound without foreign body of left hand, initial encounter: Secondary | ICD-10-CM | POA: Insufficient documentation

## 2017-10-01 DIAGNOSIS — Y998 Other external cause status: Secondary | ICD-10-CM | POA: Insufficient documentation

## 2017-10-01 DIAGNOSIS — Z23 Encounter for immunization: Secondary | ICD-10-CM | POA: Insufficient documentation

## 2017-10-01 DIAGNOSIS — T148XXA Other injury of unspecified body region, initial encounter: Secondary | ICD-10-CM

## 2017-10-01 DIAGNOSIS — W19XXXA Unspecified fall, initial encounter: Secondary | ICD-10-CM

## 2017-10-01 MED ORDER — TETANUS-DIPHTH-ACELL PERTUSSIS 5-2.5-18.5 LF-MCG/0.5 IM SUSP
0.5000 mL | Freq: Once | INTRAMUSCULAR | Status: AC
  Administered 2017-10-01: 0.5 mL via INTRAMUSCULAR
  Filled 2017-10-01: qty 0.5

## 2017-10-01 MED ORDER — IBUPROFEN 800 MG PO TABS: 800.0000 mg | ORAL_TABLET | Freq: Three times a day (TID) | ORAL | 0 refills | Status: DC | PRN

## 2017-10-01 MED ORDER — CEPHALEXIN 500 MG PO CAPS: 500.0000 mg | ORAL_CAPSULE | Freq: Four times a day (QID) | ORAL | 0 refills | Status: AC

## 2017-10-01 NOTE — ED Triage Notes (Signed)
Stepped through board on floor. L leg fell through and R leg caught away from him. Pain tailbone since and puncture wound L palm.

## 2017-10-01 NOTE — ED Notes (Signed)
See triage note  Presents with pain to lower back and bilateral hips   States he fell thur a hole in a barn    States right leg went out behind him  Also has a small puncture type wound to left hand

## 2017-10-01 NOTE — ED Provider Notes (Signed)
[facility] Emergency Department Provider Note  ____________________________________________  Time seen: Approximately 4:14 PM  I have reviewed the triage vital signs and the nursing notes.   HISTORY  Chief Complaint Puncture Wound and Tailbone Pain    HPI Richard Davis is a 42 y.o. male that presents to the emergency department for evaluation of low back and hip pain after a fall. His foot got caught in a hole and he did a splits like motion. He has been walking but with pain. Pain is primarily over his tailbone. His hand landed on a rusty nail. Tetanus shot is not up to date. No additional injuries. No SOB, CP, nausea, vomiting.       Past Medical History:  Diagnosis Date  . Arthritis    hands, foot, arm  . Depression   . GERD (gastroesophageal reflux disease)   . Hypercholesteremia   . Hypertension     Patient Active Problem List   Diagnosis Date Noted  . Avascular necrosis (HCC) 12/07/2016  . Leg pain 10/23/2016  . Left hip pain 10/05/2016  . Hypogonadism in male 03/27/2016  . Surgical complication involving left eye associated with non-ophthalmic procedure 06/17/2015  . Hx of cardiac catheterization 02/03/2015  . 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 Chest pain with high risk for cardiac etiology 01/27/2015  . Abnormal nuclear stress test 01/27/2015  . Chronic pain 01/27/2015  . GERD (gastroesophageal reflux disease) 01/27/2015  . Hyperlipidemia 01/27/2015  . Hypertension 01/27/2015  . Joint pain 01/27/2015  . Sinusitis 01/27/2015  . Tobacco use 01/27/2015  . SOB (shortness of breath) on exertion 12/31/2014  . Gynecomastia 02/10/2013  . Schizoaffective disorder (HCC) 06/04/2012    Past Surgical History:  Procedure Laterality Date  . CARDIAC CATHETERIZATION N/A 01/27/2015   Procedure: Left Heart Cath;  Surgeon: Marcina Millard, MD;  Location: [facility] INVASIVE CV LAB;  Service: Cardiovascular;  Laterality: N/A;   . ETHMOIDECTOMY Left 06/17/2015   Procedure: ETHMOIDECTOMY REVISION and removal of polyps.;  Surgeon: Vernie Murders, MD;  Location: [facility];  Service: ENT;  Laterality: Left;  . FRONTAL SINUS EXPLORATION Left 06/17/2015   Procedure: FRONTAL Sinusotomy with removal of polyps.;  Surgeon: Vernie Murders, MD;  Location: [facility];  Service: ENT;  Laterality: Left;  . IMAGE GUIDED SINUS SURGERY N/A 06/17/2015   Procedure: IMAGE GUIDED SINUS SURGERY;  Surgeon: Vernie Murders, MD;  Location: [facility];  Service: ENT;  Laterality: N/A;  GAVE DISK TO [name] PROPEL Gave 2nd disk to cece 10-24 kp  . JOINT REPLACEMENT    . MAXILLARY ANTROSTOMY Left 06/17/2015   Procedure: ENDOSCOPIC MAXILLARY ANTROSTOMY WITH REMOVAL CONTENTS;  Surgeon: Vernie Murders, MD;  Location: [facility];  Service: ENT;  Laterality: Left;  . POLYPECTOMY Left 06/17/2015   Procedure: POLYPECTOMY NASAL;  Surgeon: Vernie Murders, MD;  Location: [facility];  Service: ENT;  Laterality: Left;  . TONSILLECTOMY      Prior to Admission medications   Medication Sig Start Date End Date Taking? Authorizing Provider  ADVAIR [facility] 115-21 MCG/ACT inhaler USE 2 INHALATIONS BID 03/30/17   [provider]  albuterol (PROVENTIL HFA;VENTOLIN HFA) 108 (90 BASE) MCG/ACT inhaler Inhale 2 puffs into the lungs every 6 (six) hours as needed for wheezing or shortness of breath.    [provider]  amitriptyline (ELAVIL) 100 MG tablet Take 100 mg by mouth at bedtime.    [provider]  aspirin EC 81 MG tablet Take 81 mg  by mouth daily.    [provider]  bacitracin-polymyxin b (POLYSPORIN) ophthalmic ointment Place 1 application into the left eye every 4 (four) hours while awake. apply to left eyelid every 4 hours while awake Patient not taking: Reported on 03/20/2016 06/17/15   Lockie Mola, MD  brompheniramine-pseudoephedrine-DM 30-2-10 MG/5ML syrup Take 5 mLs by mouth 4  (four) times daily as needed. Patient not taking: Reported on 03/20/2016 01/22/16   Joni Reining, PA-C  cabergoline (DOSTINEX) 0.5 MG tablet Take 0.25 mg by mouth 2 (two) times a week.    [provider]  cephALEXin (KEFLEX) 500 MG capsule Take 1 capsule (500 mg total) by mouth 4 (four) times daily for 10 days. 10/01/17 10/11/17  Enid Derry, PA-C  clindamycin (CLEOCIN) 150 MG capsule TK ONE C PO TID 04/27/17   [provider]  dicyclomine (BENTYL) 20 MG tablet Take 1 tablet (20 mg total) by mouth 3 (three) times daily as needed for spasms. Patient not taking: Reported on 10/03/2016 09/11/16   Jennye Moccasin, MD  famotidine (PEPCID) 20 MG tablet Take 1 tablet (20 mg total) by mouth 2 (two) times daily. 04/14/17 04/14/18  Nita Sickle, MD  fluPHENAZine (PROLIXIN) 5 MG tablet Take 5 mg by mouth daily.    [provider]  fluticasone (FLONASE) 50 MCG/ACT nasal spray Place 2 sprays into both nostrils daily.    [provider]  gabapentin (NEURONTIN) 300 MG capsule Take 300 mg by mouth 3 (three) times daily.    [provider]  haloperidol (HALDOL) 10 MG tablet Take 10 mg by mouth at bedtime.    [provider]  ibuprofen (ADVIL,MOTRIN) 800 MG tablet Take 1 tablet (800 mg total) by mouth every 8 (eight) hours as needed. 10/01/17   Enid Derry, PA-C  ipratropium (ATROVENT) 0.06 % nasal spray Place into the nose. 04/10/17 11/20/17  [provider]  isosorbide mononitrate (IMDUR) 30 MG 24 hr tablet Take 30 mg by mouth daily.    [provider]  ketoconazole (NIZORAL) 2 % cream Apply 1 application topically 2 (two) times daily. Patient not taking: Reported on 03/20/2016 02/23/15   Menshew, Charlesetta Ivory, PA-C  lisinopril-hydrochlorothiazide (PRINZIDE,ZESTORETIC) 20-12.5 MG tablet Take 1 tablet by mouth daily.    [provider]  meclizine (ANTIVERT) 32 MG tablet Take 1 tablet (32 mg total) by mouth 3 (three) times daily as  needed. Patient not taking: Reported on 03/20/2016 01/17/16   Menshew, Charlesetta Ivory, PA-C  meloxicam (MOBIC) 15 MG tablet Take 1 tablet (15 mg total) by mouth daily. Patient not taking: Reported on 03/20/2016 08/29/15   Kem Boroughs B, FNP  METHADONE HCL PO Take 165 mg by mouth daily.    [provider]  methazolamide (NEPTAZANE) 50 MG tablet Take 175 mg by mouth daily.    [provider]  naproxen (NAPROSYN) 500 MG tablet  04/18/17   [provider]  omeprazole (PRILOSEC) 40 MG capsule  04/13/17   [provider]  Phenylephrine-APAP-Guaifenesin (MUCINEX FAST-MAX COLD & SINUS) 5-325-200 MG TABS Take by mouth as needed.    [provider]  QUEtiapine (SEROQUEL XR) 400 MG 24 hr tablet TK 2 TS PO QPM 03/13/17   [provider]  QUEtiapine (SEROQUEL) 300 MG tablet Take 300 mg by mouth at bedtime.    [provider]  simvastatin (ZOCOR) 40 MG tablet Take 40 mg by mouth daily.    [provider]  traZODone (DESYREL) 100 MG tablet Take  100 mg by mouth at bedtime.    [provider]    Allergies Patient has no known allergies.  Family History  Problem Relation Age of Onset  . Hypercholesterolemia Mother   . Crohn's disease Father     Social History Social History   Tobacco Use  . Smoking status: Current Every Day Smoker    Packs/day: 2.00    Years: 15.00    Pack years: 30.00    Types: Cigarettes  . Smokeless tobacco: Never Used  Substance Use Topics  . Alcohol use: No  . Drug use: No     Review of Systems  Constitutional: No fever/chills Cardiovascular: No chest pain. Respiratory: No SOB. Gastrointestinal: No abdominal pain.  No nausea, no vomiting.  Musculoskeletal: Positive for tailbone pain. Skin: Negative for rash, ecchymosis.  Positive for puncture.   ____________________________________________   PHYSICAL EXAM:  VITAL SIGNS: ED Triage Vitals  Enc Vitals Group     BP 10/01/17 1407 136/84      Pulse Rate 10/01/17 1407 (!) 114     Resp 10/01/17 1407 20     Temp 10/01/17 1407 98.2 F (36.8 C)     Temp Source 10/01/17 1407 Oral     SpO2 10/01/17 1407 95 %     Weight 10/01/17 1408 250 lb (113.4 kg)     Height 10/01/17 1408 5' 10.5" (1.791 m)     Head Circumference --      Peak Flow --      Pain Score 10/01/17 1408 8     Pain Loc --      Pain Edu? --      Excl. in GC? --      Constitutional: Alert and oriented. Well appearing and in no acute distress. Eyes: Conjunctivae are normal. PERRL. EOMI. Head: Atraumatic. ENT:      Ears:      Nose: No congestion/rhinnorhea.      Mouth/Throat: Mucous membranes are moist.  Neck: No stridor.   Cardiovascular: Normal rate, regular rhythm.  Good peripheral circulation. Respiratory: Normal respiratory effort without tachypnea or retractions. Lungs CTAB. Good air entry to the bases with no decreased or absent breath sounds. Musculoskeletal: Full range of motion to all extremities. No gross deformities appreciated.  No tenderness to palpation over lumbar spine.  Normal gait.  Patient able to flex and extend both hips without difficulty.  Patient able to externally and internally rotate both hips without difficulty.  Strength 5 out of 5 in lower extremity's bilaterally. Neurologic:  Normal speech and language. No gross focal neurologic deficits are appreciated.  Skin:  Skin is warm, dry and intact.  1 mm puncture to left hand at the base of fourth and fifth fingers.   ____________________________________________   LABS (all labs ordered are listed, but only abnormal results are displayed)  Labs Reviewed - No data to display ____________________________________________  EKG   ____________________________________________  RADIOLOGY Lexine BatonI, Hooria Gasparini, personally viewed and evaluated these images (plain radiographs) as part of my medical decision making, as well as reviewing the written report by the radiologist.  Dg  Sacrum/coccyx  Result Date: 10/01/2017 CLINICAL DATA:  Fall with tailbone pain.  Initial encounter. EXAM: SACRUM AND COCCYX - 2+ VIEW COMPARISON:  Abdominal CT 04/14/2017 FINDINGS: No evidence of sacrum or coccyx fracture or malalignment. Normal appearance of the sacroiliac joints. There is avascular necrosis with sclerosis of the right femoral head. Left hip arthroplasty. IVC filter present. IMPRESSION: 1. Negative sacrum. 2. Chronic avascular necrosis of  the right femoral head. Electronically Signed   By: Marnee Spring M.D.   On: 10/01/2017 14:54    ____________________________________________    PROCEDURES  Procedure(s) performed:    Marland KitchenMarland KitchenLaceration Repair Date/Time: 10/01/2017 6:31 PM Performed by: Enid Derry, PA-C Authorized by: Enid Derry, PA-C   Consent:    Consent obtained:  Verbal   Consent given by:  Patient   Risks discussed:  Infection, need for additional repair and poor wound healing   Alternatives discussed:  No treatment Anesthesia (see MAR for exact dosages):    Anesthesia method:  None Laceration details:    Location:  Hand Repair type:    Repair type:  Simple Treatment:    Area cleansed with:  Betadine   Amount of cleaning:  Standard   Irrigation solution:  Sterile saline Skin repair:    Repair method:  Tissue adhesive Approximation:    Approximation:  Close   Vermilion border: well-aligned   Post-procedure details:    Dressing:  Non-adherent dressing   Patient tolerance of procedure:  Tolerated well, no immediate complications      Medications  Tdap (BOOSTRIX) injection 0.5 mL (0.5 mLs Intramuscular Given 10/01/17 1733)     ____________________________________________   INITIAL IMPRESSION / ASSESSMENT AND PLAN / ED COURSE  Pertinent labs & imaging results that were available during my care of the patient were reviewed by me and considered in my medical decision making (see chart for details).  Review of the [place] CSRS was performed in  accordance of the [facility] prior to dispensing any controlled drugs.  Clinical Course as of Oct 02 1831  Mon Oct 01, 2017  1508 DG Sacrum/Coccyx [HV]    Clinical Course User Index [HV] Andris Baumann, [place]    Patient presented to the emergency department for evaluation  after fall.  Vital signs and exam are reassuring.  Coccyx x-ray negative for acute bony abnormalities.  All x-ray findings were discussed with patient.  Patient is walking without difficulty.  Patient has a small puncture laceration to left hand.  It was semi-open so it was cleaned and repaired with Dermabond.  Splint was placed to fourth and fifth fingers so that patient cannot bend fingers and open wound.  Tetanus shot was updated.  Patient will be discharged home with prescriptions for Keflex. Patient is to follow up with PCP as directed. Patient is given ED precautions to return to the ED for any worsening or new symptoms.     ____________________________________________  FINAL CLINICAL IMPRESSION(S) / ED DIAGNOSES  Final diagnoses:  Fall, initial encounter  Puncture wound      NEW MEDICATIONS STARTED DURING THIS VISIT:  ED Discharge Orders        Ordered    cephALEXin (KEFLEX) 500 MG capsule  4 times daily     10/01/17 1707    ibuprofen (ADVIL,MOTRIN) 800 MG tablet  Every 8 hours PRN     10/01/17 1707          This chart was dictated using voice recognition software/Dragon. Despite best efforts to proofread, errors can occur which can change the meaning. Any change was purely unintentional.    Enid Derry, PA-C 10/01/17 1834    Rockne Menghini, MD 10/02/17 1030

## 2018-01-18 ENCOUNTER — Encounter: Payer: Self-pay | Admitting: Emergency Medicine

## 2018-01-18 ENCOUNTER — Other Ambulatory Visit: Payer: Self-pay

## 2018-01-18 ENCOUNTER — Ambulatory Visit
Admission: EM | Admit: 2018-01-18 | Discharge: 2018-01-18 | Disposition: A | Discharge status: Home / Self Care | Payer: Medicare Other | Attending: Registered Nurse | Admitting: Registered Nurse

## 2018-01-18 DIAGNOSIS — J0101 Acute recurrent maxillary sinusitis: Secondary | ICD-10-CM | POA: Diagnosis not present

## 2018-01-18 DIAGNOSIS — H8110 Benign paroxysmal vertigo, unspecified ear: Secondary | ICD-10-CM

## 2018-01-18 DIAGNOSIS — J40 Bronchitis, not specified as acute or chronic: Secondary | ICD-10-CM

## 2018-01-18 DIAGNOSIS — J209 Acute bronchitis, unspecified: Secondary | ICD-10-CM

## 2018-01-18 MED ORDER — AMOXICILLIN-POT CLAVULANATE 875-125 MG PO TABS: 1.0000 | ORAL_TABLET | Freq: Two times a day (BID) | ORAL | 0 refills | Status: DC

## 2018-01-18 MED ORDER — MECLIZINE HCL 25 MG PO TABS: 25.0000 mg | ORAL_TABLET | Freq: Three times a day (TID) | ORAL | 0 refills | Status: DC | PRN

## 2018-01-18 MED ORDER — BENZONATATE 100 MG PO CAPS: 200.0000 mg | ORAL_CAPSULE | Freq: Three times a day (TID) | ORAL | 0 refills | Status: AC | PRN

## 2018-01-18 MED ORDER — SALINE SPRAY 0.65 % NA SOLN: 2.0000 | NASAL | 0 refills | Status: AC

## 2018-01-18 MED ORDER — PREDNISONE 10 MG (21) PO TBPK: ORAL_TABLET | ORAL | 0 refills | Status: DC

## 2018-01-18 MED ORDER — TRIAMCINOLONE ACETONIDE 55 MCG/ACT NA AERO: 2.0000 | INHALATION_SPRAY | Freq: Every day | NASAL | 0 refills | Status: DC

## 2018-01-18 NOTE — Discharge Instructions (Addendum)
Start shower morning and night Nasal saline consistent use every 2 hours while awake if congested No sugar added powerade or gatorade if muscle weakness/tremors Use nasacort consistently twice a day Stop smoking

## 2018-01-18 NOTE — ED Provider Notes (Signed)
[facility]    CSN: [id number] Arrival date & time: 01/18/18  1055     History   Chief Complaint Chief Complaint  Patient presents with  . Sinus Problem    HPI LIBORIO SACCENTE is a 42 y.o. male.   42y/o caucasian male established patient here for sinus infection, wheezing, sinus pressure/pain, sore throat, cough. Smoked 40 cigarettes so far this am.  Considering quitting.  Not taking nose sprays consistently flonase doesn't work for him using nasacort now.  Not showering daily.  Trying to splash some water from sink or shower daily to clear nose but not using nasal saline.  Typically prednisone taper 2 weeks and augmentin works best for him  Hx sinus polyps and sinus surgery by Dr Elenore Rota a couple years ago.  Gets frequent sinus infections even post surgery.  Allergies bothering him a little also  Has headache right side frontal and intermittent dizzyness when turning head PMHx vertigo and use of meclizine ran out and needs refill helpful in the past.  Not using OTC antihistamines for allergies.  Doesn't require work note     Past Medical History:  Diagnosis Date  . Arthritis    hands, foot, arm  . Depression   . GERD (gastroesophageal reflux disease)   . Hypercholesteremia   . Hypertension     Patient Active Problem List   Diagnosis Date Noted  . Avascular necrosis (HCC) 12/07/2016  . Leg pain 10/23/2016  . Left hip pain 10/05/2016  . Hypogonadism in male 03/27/2016  . Surgical complication involving left eye associated with non-ophthalmic procedure 06/17/2015  . Hx of cardiac catheterization 02/03/2015  . 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 Chest pain with high risk for cardiac etiology 01/27/2015  . Abnormal nuclear stress test 01/27/2015  . Chronic pain 01/27/2015  . GERD (gastroesophageal reflux disease) 01/27/2015  . Hyperlipidemia 01/27/2015  . Hypertension 01/27/2015  . Joint pain 01/27/2015  . Sinusitis 01/27/2015  .  Tobacco use 01/27/2015  . SOB (shortness of breath) on exertion 12/31/2014  . Gynecomastia 02/10/2013  . Schizoaffective disorder (HCC) 06/04/2012    Past Surgical History:  Procedure Laterality Date  . CARDIAC CATHETERIZATION N/A 01/27/2015   Procedure: Left Heart Cath;  Surgeon: Marcina Millard, MD;  Location: [facility] INVASIVE CV LAB;  Service: Cardiovascular;  Laterality: N/A;  . ETHMOIDECTOMY Left 06/17/2015   Procedure: ETHMOIDECTOMY REVISION and removal of polyps.;  Surgeon: Vernie Murders, MD;  Location: [facility];  Service: ENT;  Laterality: Left;  . FRONTAL SINUS EXPLORATION Left 06/17/2015   Procedure: FRONTAL Sinusotomy with removal of polyps.;  Surgeon: Vernie Murders, MD;  Location: [facility];  Service: ENT;  Laterality: Left;  . IMAGE GUIDED SINUS SURGERY N/A 06/17/2015   Procedure: IMAGE GUIDED SINUS SURGERY;  Surgeon: Vernie Murders, MD;  Location: [facility];  Service: ENT;  Laterality: N/A;  GAVE DISK TO [name] PROPEL Gave 2nd disk to cece 10-24 kp  . JOINT REPLACEMENT    . MAXILLARY ANTROSTOMY Left 06/17/2015   Procedure: ENDOSCOPIC MAXILLARY ANTROSTOMY WITH REMOVAL CONTENTS;  Surgeon: Vernie Murders, MD;  Location: [facility];  Service: ENT;  Laterality: Left;  . POLYPECTOMY Left 06/17/2015   Procedure: POLYPECTOMY NASAL;  Surgeon: Vernie Murders, MD;  Location: [facility];  Service: ENT;  Laterality: Left;  . TONSILLECTOMY         Home Medications    Prior to Admission medications   Medication Sig Start Date End Date Taking? Authorizing  Provider  ADVAIR [facility] 115-21 MCG/ACT inhaler USE 2 INHALATIONS BID 03/30/17  Yes [provider]  albuterol (PROVENTIL HFA;VENTOLIN HFA) 108 (90 BASE) MCG/ACT inhaler Inhale 2 puffs into the lungs every 6 (six) hours as needed for wheezing or shortness of breath.   Yes [provider]  amitriptyline (ELAVIL) 100 MG tablet Take 100 mg by mouth at bedtime.   Yes [provider]  lisinopril-hydrochlorothiazide (PRINZIDE,ZESTORETIC) 20-12.5 MG tablet Take 1 tablet by mouth daily.   Yes [provider]  METHADONE HCL PO Take 165 mg by mouth daily.   Yes [provider]  QUEtiapine (SEROQUEL XR) 400 MG 24 hr tablet TK 2 TS PO QPM 03/13/17  Yes [provider]  QUEtiapine (SEROQUEL) 300 MG tablet Take 300 mg by mouth at bedtime.   Yes [provider]  simvastatin (ZOCOR) 40 MG tablet Take 40 mg by mouth daily.   Yes [provider]  traZODone (DESYREL) 100 MG tablet Take 100 mg by mouth at bedtime.   Yes [provider]  amoxicillin-clavulanate (AUGMENTIN) 875-125 MG tablet Take 1 tablet by mouth every 12 (twelve) hours. 01/18/18   Jerrian Mells, Jarold Song, NP  benzonatate (TESSALON) 100 MG capsule Take 2 capsules (200 mg total) by mouth 3 (three) times daily as needed for up to 10 days for cough. 01/18/18 01/28/18  Keala Drum, Jarold Song, NP  cabergoline (DOSTINEX) 0.5 MG tablet Take 0.25 mg by mouth 2 (two) times a week.    [provider]  fluPHENAZine (PROLIXIN) 5 MG tablet Take 5 mg by mouth daily.    [provider]  gabapentin (NEURONTIN) 300 MG capsule Take 300 mg by mouth 3 (three) times daily.    [provider]  haloperidol (HALDOL) 10 MG tablet Take 10 mg by mouth at bedtime.    [provider]  isosorbide mononitrate (IMDUR) 30 MG 24 hr tablet Take 30 mg by mouth daily.    [provider]  meclizine (ANTIVERT) 25 MG tablet Take 1 tablet (25 mg total) by mouth 3 (three) times daily as needed for dizziness. 01/18/18   Kushi Kun, Jarold Song, NP  predniSONE (STERAPRED UNI-PAK 21 TAB) 10 MG (21) TBPK tablet Taper take 6 pills day 1-2; 5 pills day 3-4; 4 pills day 5-6; 3 pills day 7-8; 2 pills day 9-10 and 1 pill day 11-12 with breakfast daily 01/18/18   Tura Roller, Jarold Song, NP  sodium chloride (OCEAN) 0.65 % SOLN nasal spray Place 2 sprays into both nostrils every 2 (two) hours  while awake. 01/18/18 02/17/18  Sima Lindenberger, Jarold Song, NP  triamcinolone (NASACORT) 55 MCG/ACT AERO nasal inhaler Place 2 sprays into the nose daily. 01/18/18   Kaylin Marcon, Jarold Song, NP    Family History Family History  Problem Relation Age of Onset  . Hypercholesterolemia Mother   . Crohn's disease Father     Social History Social History   Tobacco Use  . Smoking status: Current Every Day Smoker    Packs/day: 2.00    Years: 15.00    Pack years: 30.00    Types: Cigarettes  . Smokeless tobacco: Never Used  Substance Use Topics  . Alcohol use: No  . Drug use: No     Allergies   Patient has no known allergies.   Review of Systems Review of Systems  Constitutional: Positive for fatigue. Negative for activity change, appetite change, chills, diaphoresis, fever and unexpected weight change.  HENT: Positive for congestion, postnasal drip, rhinorrhea, sinus pressure, sinus pain  and sore throat. Negative for dental problem, drooling, ear discharge, ear pain, facial swelling, hearing loss, mouth sores, nosebleeds, sneezing, tinnitus, trouble swallowing and voice change.   Eyes: Negative for photophobia, discharge and visual disturbance.  Respiratory: Positive for cough and wheezing. Negative for choking, chest tightness, shortness of breath and stridor.   Cardiovascular: Negative for chest pain.  Gastrointestinal: Negative for abdominal pain, blood in stool, diarrhea, nausea and vomiting.  Endocrine: Negative for cold intolerance and heat intolerance.  Genitourinary: Negative for dysuria.  Musculoskeletal: Negative for gait problem, neck pain and neck stiffness.  Skin: Negative for color change, pallor, rash and wound.  Allergic/Immunologic: Positive for environmental allergies. Negative for food allergies and immunocompromised state.  Neurological: Positive for dizziness and headaches. Negative for tremors, seizures, syncope, facial asymmetry, speech difficulty, weakness, light-headedness  and numbness.  Hematological: Negative for adenopathy. Does not bruise/bleed easily.  Psychiatric/Behavioral: Negative for agitation, confusion and sleep disturbance.     Physical Exam Triage Vital Signs ED Triage Vitals  Enc Vitals Group     BP 01/18/18 1121 106/79     Pulse Rate 01/18/18 1121 90     Resp 01/18/18 1121 16     Temp 01/18/18 1121 97.6 F (36.4 C)     Temp Source 01/18/18 1121 Oral     SpO2 01/18/18 1121 96 %     Weight 01/18/18 1117 260 lb (117.9 kg)     Height 01/18/18 1117 5' 10.5" (1.791 m)     Head Circumference --      Peak Flow --      Pain Score 01/18/18 1117 8     Pain Loc --      Pain Edu? --      Excl. in GC? --    No data found.  Updated Vital Signs BP 106/79 (BP Location: Left Arm)   Pulse 90   Temp 97.6 F (36.4 C) (Oral)   Resp 16   Ht 5' 10.5" (1.791 m)   Wt 260 lb (117.9 kg)   SpO2 96%   BMI 36.78 kg/m    Physical Exam  Constitutional: He is oriented to person, place, and time. Vital signs are normal. He appears well-developed and well-nourished. He is active and cooperative.  Non-toxic appearance. He does not have a sickly appearance. He appears ill. No distress.  HENT:  Head: Normocephalic and atraumatic.  Right Ear: Hearing, external ear and ear canal normal. A middle ear effusion is present.  Left Ear: Hearing, external ear and ear canal normal. A middle ear effusion is present.  Nose: Mucosal edema and rhinorrhea present. No nose lacerations, sinus tenderness, nasal deformity, septal deviation or nasal septal hematoma. No epistaxis.  No foreign bodies. Right sinus exhibits maxillary sinus tenderness. Right sinus exhibits no frontal sinus tenderness. Left sinus exhibits maxillary sinus tenderness. Left sinus exhibits no frontal sinus tenderness.  Mouth/Throat: Uvula is midline and mucous membranes are normal. Mucous membranes are not pale, not dry and not cyanotic. He does not have dentures. No oral lesions. No trismus in the jaw.  Normal dentition. No dental abscesses, uvula swelling, lacerations or dental caries. Posterior oropharyngeal edema and posterior oropharyngeal erythema present. No oropharyngeal exudate or tonsillar abscesses. No tonsillar exudate.  Maxillary bilaterally pain with palpation; reports right frontal headache; clear discharge bilateral nares; bilateral TMs air fluid level clear; bilateral allergic shiners; cobblestoning posterior pharynx  Eyes: Pupils are equal, round, and reactive to light. Conjunctivae, EOM and lids are normal. Right eye exhibits no chemosis,  no discharge, no exudate and no hordeolum. No foreign body present in the right eye. Left eye exhibits no chemosis, no discharge, no exudate and no hordeolum. No foreign body present in the left eye. Right conjunctiva is not injected. Right conjunctiva has no hemorrhage. Left conjunctiva is not injected. Left conjunctiva has no hemorrhage. No scleral icterus. Right eye exhibits normal extraocular motion and no nystagmus. Left eye exhibits normal extraocular motion and no nystagmus. Right pupil is round and reactive. Left pupil is round and reactive. Pupils are equal.  Neck: Trachea normal, normal range of motion and phonation normal. Neck supple. No tracheal tenderness, no spinous process tenderness and no muscular tenderness present. No neck rigidity. No tracheal deviation, no edema, no erythema and normal range of motion present. No thyroid mass and no thyromegaly present.  Cardiovascular: Normal rate, regular rhythm, S1 normal, S2 normal, normal heart sounds and intact distal pulses. PMI is not displaced. Exam reveals no gallop and no friction rub.  No murmur heard. Pulmonary/Chest: Effort normal. No stridor. No respiratory distress. He has no decreased breath sounds. He has wheezes. He has no rhonchi. He has no rales.  Inspiratory wheeze fine bilaterally throughout refused albuterol treatment in clinic; spoke full sentences without difficulty in exam  room; cough not observed  Abdominal: Soft. Normal appearance. He exhibits no distension and no fluid wave. There is no rigidity and no guarding.  Musculoskeletal: Normal range of motion. He exhibits no edema or tenderness.       Right shoulder: Normal.       Left shoulder: Normal.       Right elbow: Normal.      Left elbow: Normal.       Right hip: Normal.       Left hip: Normal.       Right knee: Normal.       Left knee: Normal.       Cervical back: Normal.       Thoracic back: Normal.       Lumbar back: Normal.       Right hand: Normal.       Left hand: Normal.  Lymphadenopathy:       Head (right side): No submental, no submandibular, no tonsillar, no preauricular, no posterior auricular and no occipital adenopathy present.       Head (left side): No submental, no submandibular, no tonsillar, no preauricular, no posterior auricular and no occipital adenopathy present.    He has no cervical adenopathy.       Right cervical: No superficial cervical, no deep cervical and no posterior cervical adenopathy present.      Left cervical: No superficial cervical, no deep cervical and no posterior cervical adenopathy present.  Neurological: He is alert and oriented to person, place, and time. He has normal strength. He is not disoriented. He displays no atrophy and no tremor. No cranial nerve deficit or sensory deficit. He exhibits normal muscle tone. He displays no seizure activity. Coordination and gait normal. GCS eye subscore is 4. GCS verbal subscore is 5. GCS motor subscore is 6.  In/out of chair/on/off exam table without difficulty; gait sure and steady in hallway  Skin: Skin is warm, dry and intact. Capillary refill takes less than 2 seconds. No abrasion, no bruising, no burn, no ecchymosis, no laceration, no lesion, no petechiae and no rash noted. He is not diaphoretic. No cyanosis or erythema. No pallor. Nails show no clubbing.  Psychiatric: He has a normal mood and affect.  His speech is  normal and behavior is normal. Judgment and thought content normal. He is not actively hallucinating. Cognition and memory are normal. He is attentive.  Nursing note and vitals reviewed.    UC Treatments / Results  Labs (all labs ordered are listed, but only abnormal results are displayed) Labs Reviewed - No data to display  EKG None  Radiology No results found.  Procedures Procedures (including critical care time)  Medications Ordered in UC Medications - No data to display  Initial Impression / Assessment and Plan / UC Course  I have reviewed the triage vital signs and the nursing notes.  Pertinent labs & imaging results that were available during my care of the patient were reviewed by me and considered in my medical decision making (see chart for details).    Restart nasacort 1 spray each nostril BID #1 RF0 electronic Rx to his pharmacy of choice consistently/daily use, saline 2 sprays each nostril q2h wa prn congestion.  Prednisone 60mg  taper 2 days each dose with breakfast po (60/50/40/30/20/10mg ) #42 RF0 electronic Rx to his pharmacy of choice.  If no improvement with 48 hours of saline and nasacort and prednisone use start augmentin 875mg  po BID x 10 days #20 RF0.  Electronic Rx given.  Denied personal or family history of ENT cancer.  Shower BID especially prior to bed. No evidence of systemic bacterial infection, non toxic and well hydrated.  I do not see where any further testing or imaging is necessary at this time.   I will suggest supportive care, rest, good hygiene and encourage the patient to take adequate fluids.  The patient is to return to clinic or EMERGENCY ROOM if symptoms worsen or change significantly.  Exitcare handout on nasal polyps, sinusitis and sinus rinse given to patient.  Patient verbalized agreement and understanding of treatment plan and had no further questions at this time.   P2:  Hand washing and cover cough   Given printed information on free  [place] tobacco cessation classes day/evening and online/telephonic upcoming.  Discussed possible hypokalemia and increased BP/HR with prednisone use if worst headache of life stop prednisone and seek re-evaluation. Discussed signs and symptoms of hypokalemia with patient e.g. Muscle tremors, weakness, fatigue, palpitations.   Electronic Rx tessalon pearles 200mg  po TID prn cough #30 RF0 to his pharmacy of choice.  Cough lozenges po q2h prn cough OTC.  Prednisone taper 10mg  (60/50/40/30/20/10mg x 2 days each) po daily with breakfast #42 RF0 Electronic Rx to his pharmacy of choice  Discussed possible side effects increased/decreased appetite, difficulty sleeping, increased blood sugar, increased blood pressure and heart rate.  Albuterol MDI 1-2 puffs po q4-6h prn protracted cough/wheeze at home;possible side effect increased heart rate. Bronchitis simple, community acquired, may have started as viral (probably respiratory syncytial, parainfluenza, influenza, or adenovirus), but now evidence of acute purulent bronchitis with resultant bronchial edema and mucus formation.  Viruses are the most common cause of bronchial inflammation in otherwise healthy adults with acute bronchitis.  The appearance of sputum is not predictive of whether a bacterial infection is present.  Purulent sputum is most often caused by viral infections.  There are a small portion of those caused by non-viral agents being Mycoplama pneumonia.  Microscopic examination or C&S of sputum in the healthy adult with acute bronchitis is generally not helpful (usually negative or normal respiratory flora) other considerations being cough from upper respiratory tract infections, sinusitis or allergic syndromes (mild asthma or viral pneumonia).  Differential  Diagnoses:  reactive airway disease (asthma, allergic aspergillosis (eosinophilia), chronic bronchitis, respiratory infection (sinusitis, common cold, pneumonia), congestive heart failure,  reflux esophagitis, bronchogenic tumor, aspiration syndromes and/or exposure to pulmonary irritants/smoke.   Without high fever, severe dyspnea, lack of physical findings or other risk factors, I will hold on a chest radiograph and CBC at this time.  I discussed that approximately 50% of patients with acute bronchitis have a cough that lasts up to three weeks, and 25% for over a month.  Tylenol 500mg  one to two tablets every four to six hours as needed for fever or myalgias.  Exitcare handout on coping with quitting smoking, bronchitis and inhaler use given to patient.  ER if hemopthysis, SOB, worst chest pain of life.   Patient instructed to follow up in one week or sooner if symptoms worsen.  Patient verbalized agreement and understanding of treatment plan.  P2:  hand washing and cover cough  Discussed with patient otitis media with effusion probably causing vertigo but could also be age. Meclizine helpful in the past ran out.  Refill meclizine 25mg  po QID prn vertigo #30 RF0. Supportive treatment may take up to 4 doses meclizine per day max 100mg  per 24 hours. Discussed signs/symptoms stroke.recommended not driving during vertigo episodes. Follow up if aphasia, dysphasia, visual changes, weakness, fall, worst headache of life, incoordination, fever, ear discharge. Consider ENT evaluation/follow up with PCM if worsening symptoms not controlled with meclizine or needs Rx refill. exitcare handout on vertigo given.   Patient verbalized understanding of information/agreed with plan of care and had no further questions at this time.   Final Clinical Impressions(s) / UC Diagnoses   Final diagnoses:  Acute recurrent maxillary sinusitis  Benign paroxysmal positional vertigo, unspecified laterality  Acute bronchitis, unspecified organism     Discharge Instructions     Start shower morning and night Nasal saline consistent use every 2 hours while awake if congested No sugar added powerade or gatorade if  muscle weakness/tremors Use nasacort consistently twice a day Stop smoking    ED Prescriptions    Medication Sig Dispense Auth. Provider   sodium chloride (OCEAN) 0.65 % SOLN nasal spray Place 2 sprays into both nostrils every 2 (two) hours while awake.  Bethel Gaglio, Jarold Song, NP   meclizine (ANTIVERT) 25 MG tablet Take 1 tablet (25 mg total) by mouth 3 (three) times daily as needed for dizziness. 30 tablet Keyari Kleeman A, NP   triamcinolone (NASACORT) 55 MCG/ACT AERO nasal inhaler Place 2 sprays into the nose daily. 1 Inhaler Ame Heagle A, NP   predniSONE (STERAPRED UNI-PAK 21 TAB) 10 MG (21) TBPK tablet Taper take 6 pills day 1-2; 5 pills day 3-4; 4 pills day 5-6; 3 pills day 7-8; 2 pills day 9-10 and 1 pill day 11-12 with breakfast daily 42 tablet Otis Burress A, NP   amoxicillin-clavulanate (AUGMENTIN) 875-125 MG tablet Take 1 tablet by mouth every 12 (twelve) hours. 20 tablet Neeva Trew A, NP   benzonatate (TESSALON) 100 MG capsule Take 2 capsules (200 mg total) by mouth 3 (three) times daily as needed for up to 10 days for cough. 30 capsule Lorenza Shakir, Jarold Song, NP     Controlled Substance Prescriptions [place] Controlled Substance Registry consulted? Not Applicable   Barbaraann Barthel, NP 01/18/18 2014

## 2018-01-18 NOTE — ED Triage Notes (Signed)
Patient c/o sinus congestion and pressure that started 3-4 days ago.  Patient denies fevers.

## 2018-04-14 ENCOUNTER — Ambulatory Visit
Admission: EM | Admit: 2018-04-14 | Discharge: 2018-04-14 | Disposition: A | Discharge status: Home / Self Care | Payer: Medicare Other | Attending: Emergency Medicine | Admitting: Emergency Medicine

## 2018-04-14 ENCOUNTER — Encounter: Payer: Self-pay | Admitting: Gynecology

## 2018-04-14 DIAGNOSIS — K0889 Other specified disorders of teeth and supporting structures: Secondary | ICD-10-CM | POA: Diagnosis not present

## 2018-04-14 DIAGNOSIS — S025XXA Fracture of tooth (traumatic), initial encounter for closed fracture: Secondary | ICD-10-CM

## 2018-04-14 HISTORY — DX: Chronic obstructive pulmonary disease, unspecified: J44.9

## 2018-04-14 MED ORDER — AMOXICILLIN-POT CLAVULANATE 875-125 MG PO TABS: 1.0000 | ORAL_TABLET | Freq: Two times a day (BID) | ORAL | 0 refills | Status: DC

## 2018-04-14 MED ORDER — HYDROCODONE-ACETAMINOPHEN 5-325 MG PO TABS: 1.0000 | ORAL_TABLET | Freq: Four times a day (QID) | ORAL | 0 refills | Status: DC | PRN

## 2018-04-14 NOTE — ED Provider Notes (Signed)
[facility]    CSN: [id number] Arrival date & time: 04/14/18  1502     History   Chief Complaint Chief Complaint  Patient presents with  . Dental Pain    HPI Richard Davis is a 42 y.o. male.   HPI  43 year old male presents with dental pain.  He broke tooth off his lower incisor on the left.  Days ago he began having significant pain in that area.  Denies any fever or chills. Is been trying to reach a dentist today and has plans on visiting one tomorrow.  He plans on having all of his dentition extracted and having dentures placed.  Has not been able to eat because of the pain with chewing.      Past Medical History:  Diagnosis Date  . Arthritis    hands, foot, arm  . COPD (chronic obstructive pulmonary disease) (HCC)   . Depression   . GERD (gastroesophageal reflux disease)   . Hypercholesteremia   . Hypertension     Patient Active Problem List   Diagnosis Date Noted  . Avascular necrosis (HCC) 12/07/2016  . Leg pain 10/23/2016  . Left hip pain 10/05/2016  . Hypogonadism in male 03/27/2016  . Surgical complication involving left eye associated with non-ophthalmic procedure 06/17/2015  . Hx of cardiac catheterization 02/03/2015  . 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 Chest pain with high risk for cardiac etiology 01/27/2015  . Abnormal nuclear stress test 01/27/2015  . Chronic pain 01/27/2015  . GERD (gastroesophageal reflux disease) 01/27/2015  . Hyperlipidemia 01/27/2015  . Hypertension 01/27/2015  . Joint pain 01/27/2015  . Sinusitis 01/27/2015  . Tobacco use 01/27/2015  . SOB (shortness of breath) on exertion 12/31/2014  . Gynecomastia 02/10/2013  . Schizoaffective disorder (HCC) 06/04/2012    Past Surgical History:  Procedure Laterality Date  . CARDIAC CATHETERIZATION N/A 01/27/2015   Procedure: Left Heart Cath;  Surgeon: Marcina Millard, MD;  Location: [facility] INVASIVE CV LAB;  Service: Cardiovascular;   Laterality: N/A;  . ETHMOIDECTOMY Left 06/17/2015   Procedure: ETHMOIDECTOMY REVISION and removal of polyps.;  Surgeon: Vernie Murders, MD;  Location: [facility] SURGERY CNTR;  Service: ENT;  Laterality: Left;  . FRONTAL SINUS EXPLORATION Left 06/17/2015   Procedure: FRONTAL Sinusotomy with removal of polyps.;  Surgeon: Vernie Murders, MD;  Location: [facility];  Service: ENT;  Laterality: Left;  . IMAGE GUIDED SINUS SURGERY N/A 06/17/2015   Procedure: IMAGE GUIDED SINUS SURGERY;  Surgeon: Vernie Murders, MD;  Location: [facility] SURGERY [facility];  Service: ENT;  Laterality: N/A;  GAVE DISK TO [name] PROPEL Gave 2nd disk to cece 10-24 kp  . JOINT REPLACEMENT    . MAXILLARY ANTROSTOMY Left 06/17/2015   Procedure: ENDOSCOPIC MAXILLARY ANTROSTOMY WITH REMOVAL CONTENTS;  Surgeon: Vernie Murders, MD;  Location: [facility] SURGERY [facility];  Service: ENT;  Laterality: Left;  . POLYPECTOMY Left 06/17/2015   Procedure: POLYPECTOMY NASAL;  Surgeon: Vernie Murders, MD;  Location: [facility] SURGERY [facility]TR;  Service: ENT;  Laterality: Left;  . TONSILLECTOMY         Home Medications    Prior to Admission medications   Medication Sig Start Date End Date Taking? Authorizing Provider  ADVAIR [facility] 115-21 MCG/ACT inhaler USE 2 INHALATIONS BID 03/30/17  Yes [provider]  albuterol (PROVENTIL HFA;VENTOLIN HFA) 108 (90 BASE) MCG/ACT inhaler Inhale 2 puffs into the lungs every 6 (six) hours as needed for wheezing or shortness of breath.   Yes [provider]  amitriptyline (ELAVIL)  100 MG tablet Take 100 mg by mouth at bedtime.   Yes [provider]  cabergoline (DOSTINEX) 0.5 MG tablet Take 0.25 mg by mouth 2 (two) times a week.   Yes [provider]  gabapentin (NEURONTIN) 300 MG capsule Take 300 mg by mouth 3 (three) times daily.   Yes [provider]  haloperidol (HALDOL) 10 MG tablet Take 10 mg by mouth at bedtime.   Yes [provider]  lisinopril-hydrochlorothiazide  (PRINZIDE,ZESTORETIC) 20-12.5 MG tablet Take 1 tablet by mouth daily.   Yes [provider]  QUEtiapine (SEROQUEL XR) 400 MG 24 hr tablet TK 2 TS PO QPM 03/13/17  Yes [provider]  QUEtiapine (SEROQUEL) 300 MG tablet Take 300 mg by mouth at bedtime.   Yes [provider]  simvastatin (ZOCOR) 40 MG tablet Take 40 mg by mouth daily.   Yes [provider]  traZODone (DESYREL) 100 MG tablet Take 100 mg by mouth at bedtime.   Yes [provider]  amoxicillin-clavulanate (AUGMENTIN) 875-125 MG tablet Take 1 tablet by mouth every 12 (twelve) hours. 04/14/18   Lutricia Feil, PA-C  HYDROcodone-acetaminophen (NORCO/VICODIN) 5-325 MG tablet Take 1-2 tablets by mouth every 6 (six) hours as needed. 04/14/18   Lutricia Feil, PA-C  sodium chloride (OCEAN) 0.65 % SOLN nasal spray Place 2 sprays into both nostrils every 2 (two) hours while awake. 01/18/18 02/17/18  Betancourt, Jarold Song, NP    Family History Family History  Problem Relation Age of Onset  . Hypercholesterolemia Mother   . Crohn's disease Father     Social History Social History   Tobacco Use  . Smoking status: Current Every Day Smoker    Packs/day: 2.00    Years: 15.00    Pack years: 30.00    Types: Cigarettes  . Smokeless tobacco: Never Used  Substance Use Topics  . Alcohol use: No  . Drug use: No     Allergies   Patient has no known allergies.   Review of Systems Review of Systems  Constitutional: Positive for activity change. Negative for appetite change, chills, diaphoresis, fatigue and fever.  HENT: Positive for dental problem.   All other systems reviewed and are negative.    Physical Exam Triage Vital Signs ED Triage Vitals  Enc Vitals Group     BP 04/14/18 1511 131/88     Pulse Rate 04/14/18 1511 (!) 105     Resp 04/14/18 1511 16     Temp 04/14/18 1511 98.1 F (36.7 C)     Temp Source 04/14/18 1511 Oral     SpO2 04/14/18 1511 95 %     Weight 04/14/18 1512 265  lb (120.2 kg)     Height 04/14/18 1512 5\' 11"  (1.803 m)     Head Circumference --      Peak Flow --      Pain Score 04/14/18 1634 0     Pain Loc --      Pain Edu? --      Excl. in GC? --    No data found.  Updated Vital Signs BP 131/88 (BP Location: Left Arm)   Pulse (!) 105   Temp 98.1 F (36.7 C) (Oral)   Resp 16   Ht 5\' 11"  (1.803 m)   Wt 265 lb (120.2 kg)   SpO2 95%   BMI 36.96 kg/m   Visual Acuity Right Eye Distance:   Left Eye Distance:   Bilateral Distance:    Right Eye  Near:   Left Eye Near:    Bilateral Near:     Physical Exam  Constitutional: He is oriented to person, place, and time. He appears well-developed and well-nourished. No distress.  HENT:  Head: Normocephalic.  Mouth/Throat:    All his dentition is in poor repair.  He has numerous teeth missing.  In the left lower incisors I have been broken at the jawline.  Is very tender to touch but no evidence of abscess is present.  Eyes: Pupils are equal, round, and reactive to light.  Neck: Normal range of motion.  Musculoskeletal: Normal range of motion.  Lymphadenopathy:    He has no cervical adenopathy.  Neurological: He is alert and oriented to person, place, and time.  Skin: Skin is warm and dry. He is not diaphoretic.  Psychiatric: He has a normal mood and affect. His behavior is normal. Judgment and thought content normal.  Nursing note and vitals reviewed.    UC Treatments / Results  Labs (all labs ordered are listed, but only abnormal results are displayed) Labs Reviewed - No data to display  EKG None  Radiology No results found.  Procedures Procedures (including critical care time)  Medications Ordered in UC Medications - No data to display  Initial Impression / Assessment and Plan / UC Course  I have reviewed the triage vital signs and the nursing notes.  Pertinent labs & imaging results that were available during my care of the patient were reviewed by me and considered in  my medical decision making (see chart for details).     Advised patient to eat only soft foods until seen by the dentist.  Provide him with Augmentin for infection and a few Vicodin for use at nighttime.  I have recommend he use Tylenol 500 mg combined with 400 mg of ibuprofen every 6 hours.  Apply cool compresses to the outside of his mouth.  Will contact the dentist tomorrow for an appointment. Final Clinical Impressions(s) / UC Diagnoses   Final diagnoses:  Pain, dental  Closed fracture of tooth, initial encounter     Discharge Instructions     Soft foods until seen by the dentist.  Try using ibuprofen 400 mg combined with Tylenol 500 mg every 6 hours instead of the narcotics.  Use narcotics mainly at nighttime to be able to sleep.   ED Prescriptions    Medication Sig Dispense Auth. Provider   amoxicillin-clavulanate (AUGMENTIN) 875-125 MG tablet Take 1 tablet by mouth every 12 (twelve) hours. 20 tablet Lutricia Feil, PA-C   HYDROcodone-acetaminophen (NORCO/VICODIN) 5-325 MG tablet Take 1-2 tablets by mouth every 6 (six) hours as needed. 10 tablet Lutricia Feil, PA-C     Controlled Substance Prescriptions [place] Controlled Substance Registry consulted? Not Applicable   Lutricia Feil, PA-C 04/14/18 1610

## 2018-04-14 NOTE — Discharge Instructions (Signed)
Soft foods until seen by the dentist.  Try using ibuprofen 400 mg combined with Tylenol 500 mg every 6 hours instead of the narcotics.  Use narcotics mainly at nighttime to be able to sleep.

## 2018-04-14 NOTE — ED Triage Notes (Signed)
Per patient with dental pain. Pt.  Present with broken tooth at his lower jaw.

## 2018-08-12 IMAGING — CT CT ABD-PELV W/ CM
2 of 5 series · 15 of 46 positions shown, 17 images · IV contrast (iopamidol)
Comparison: CT the abdomen and pelvis 09/11/2016.

CLINICAL DATA: 41-year-old male with history of left-sided
abdominal pain for 1 week. Pain worsens with an eating and drinking.

EXAM:
CT ABDOMEN AND PELVIS WITH CONTRAST
TECHNIQUE: Multidetector CT imaging of the abdomen and pelvis was performed
using the standard protocol following bolus administration of
intravenous contrast.
CONTRAST:  125mL A0QFR1-JHH IOPAMIDOL (A0QFR1-JHH) INJECTION 61%

[Series 2: routine abd/pel with · axial · 0.86mm/px · z∈[-414,+21]mm · 12 of 99 slices shown, 14 images]
[im 6/99  soft-tissue]
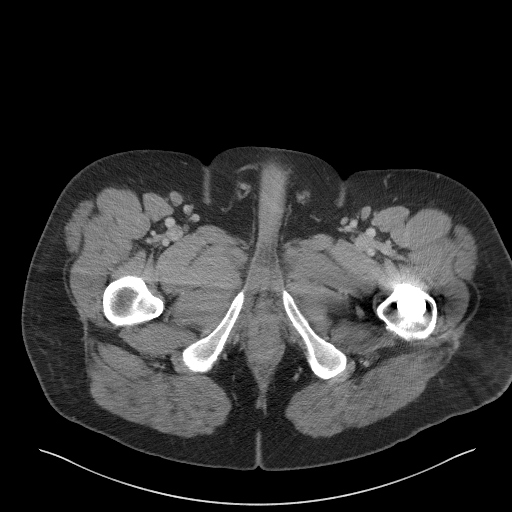
[im 6/99  bone]
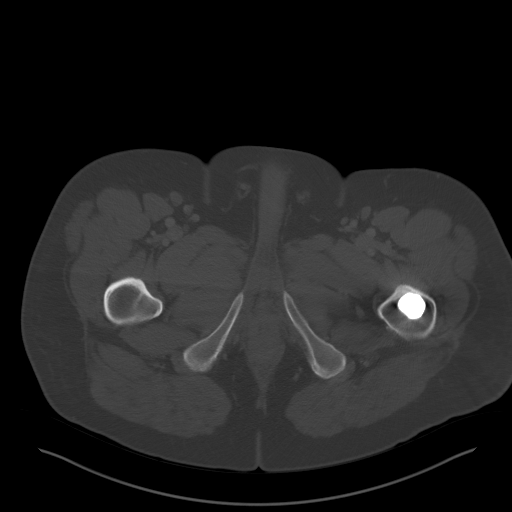
[im 17/99  soft-tissue]
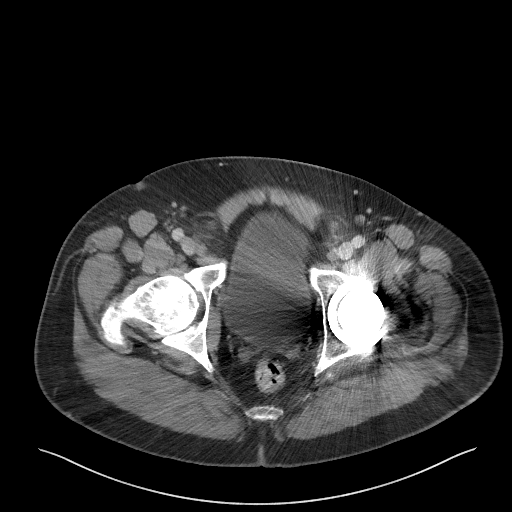
[im 22/99  soft-tissue]
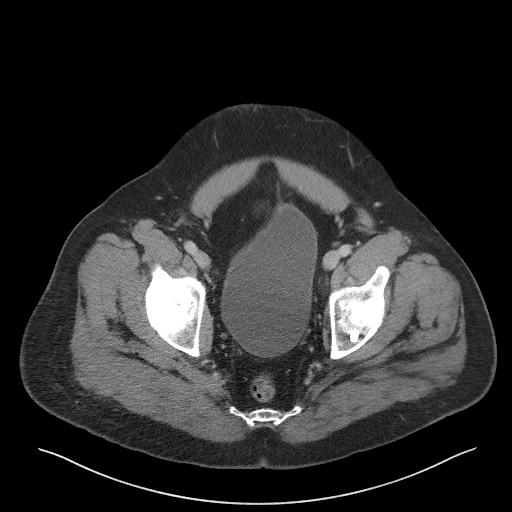
[im 28/99  soft-tissue]
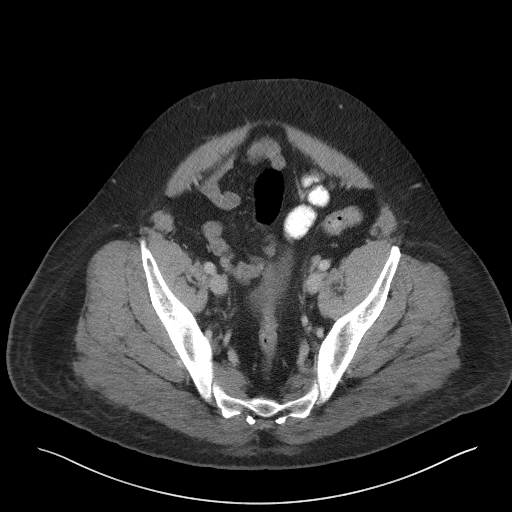
[im 39/99  soft-tissue]
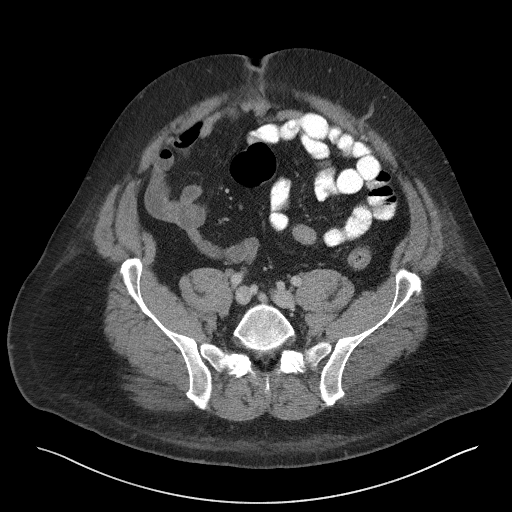
[im 44/99  soft-tissue]
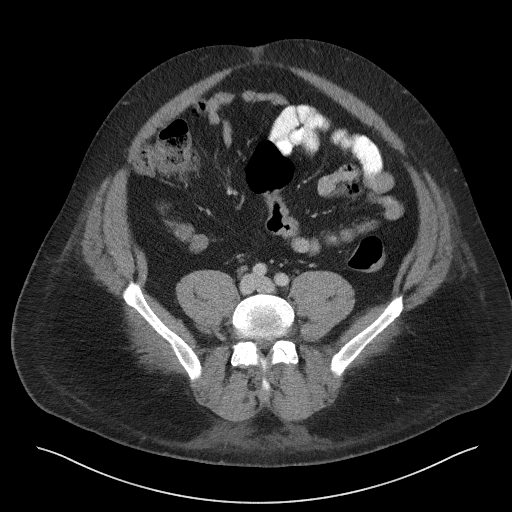
[im 55/99  soft-tissue]
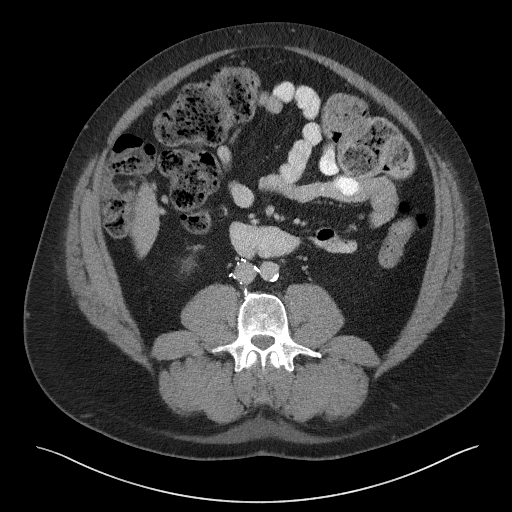
[im 60/99  soft-tissue]
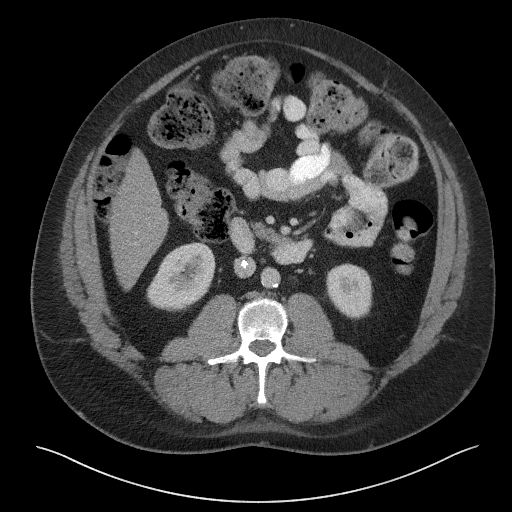
[im 71/99  soft-tissue]
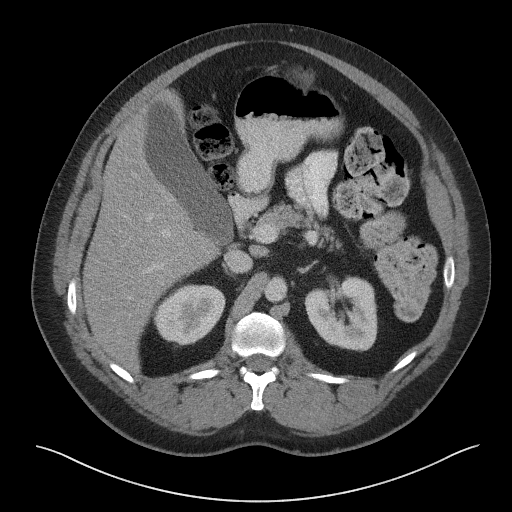
[im 71/99  bone]
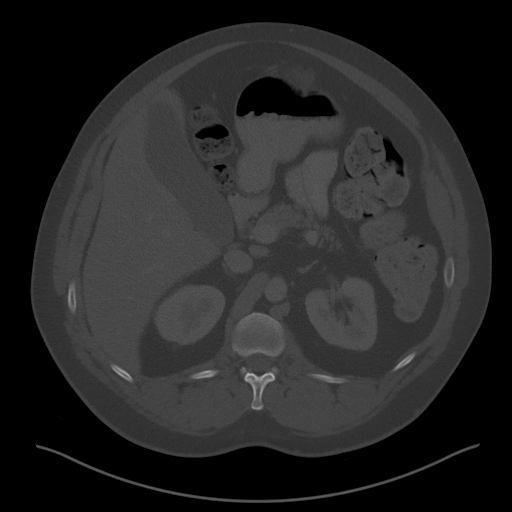
[im 77/99  soft-tissue]
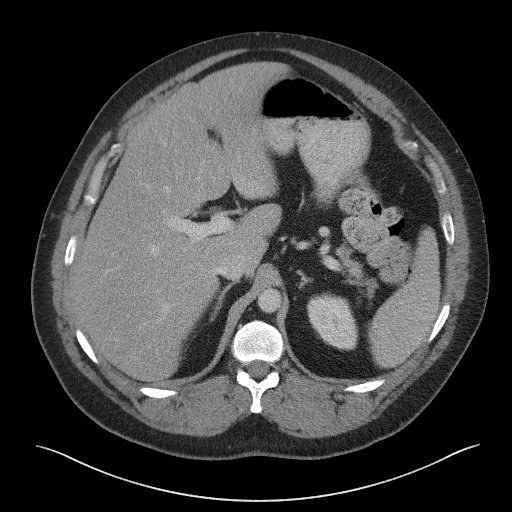
[im 82/99  soft-tissue]
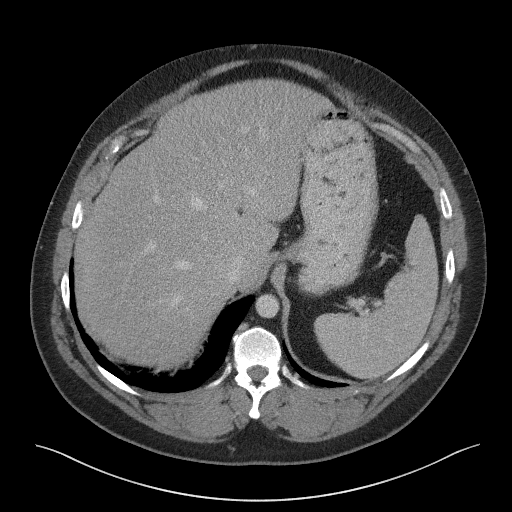
[im 93/99  soft-tissue]
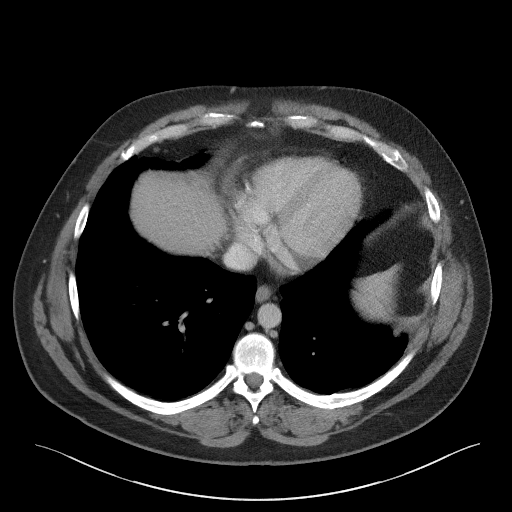

[Series 5: coronal st · coronal · 0.68mm/px · 3 of 124 slices shown]
[im 42/124  soft-tissue]
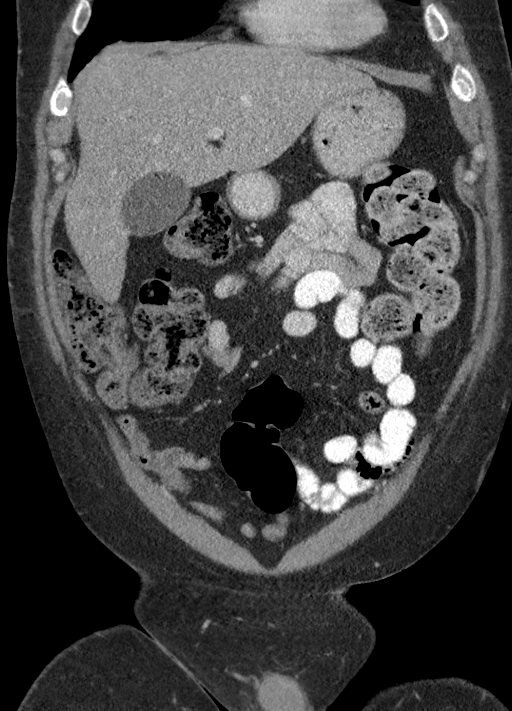
[im 55/124  soft-tissue]
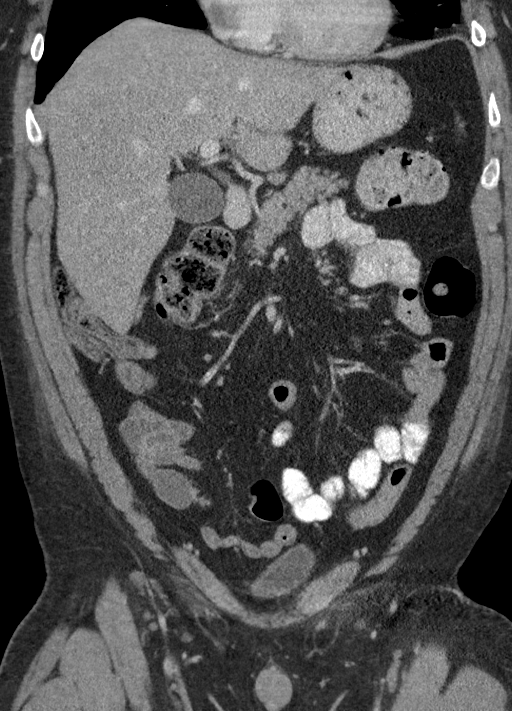
[im 69/124  soft-tissue]
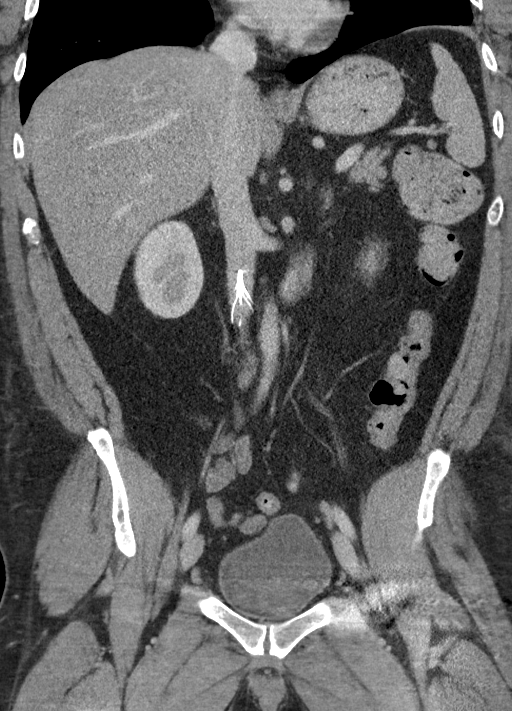

[15 of 46 positions shown; findings below may reference images not displayed]

FINDINGS: Lower chest: Mild scarring in the left lung base, similar to the
prior examination.

Hepatobiliary: No cystic or solid hepatic lesions. No intra or
extrahepatic biliary ductal dilatation. Gallbladder is moderately
distended, but otherwise unremarkable in appearance.

Pancreas: No pancreatic mass. No pancreatic ductal dilatation. No
pancreatic or peripancreatic fluid or inflammatory changes.

Spleen: Unremarkable.

Adrenals/Urinary Tract: Bilateral kidneys and bilateral adrenal
glands are normal in appearance. No hydroureteronephrosis. Urinary
bladder is normal in appearance.

Stomach/Bowel: The appearance of the stomach is normal. No
pathologic dilatation of small bowel or colon. Normal appendix.

Vascular/Lymphatic: Aortic atherosclerosis, without evidence of
aneurysm or dissection in the abdominal or pelvic vasculature. IVC
filter noted with tip terminating shortly before the level of the
renal veins. No lymphadenopathy identified in the abdomen or pelvis.

Reproductive: Prostate gland and seminal vesicles are unremarkable
in appearance.

Other: No significant volume of ascites.  No pneumoperitoneum.

Musculoskeletal: There are no aggressive appearing lytic or blastic
lesions noted in the visualized portions of the skeleton. Status
post left hip arthroplasty.
IMPRESSION: 1. No acute findings are noted in the abdomen or pelvis to account
for the patient's symptoms.
2. Normal appendix.
3. Aortic atherosclerosis.
4. Additional incidental findings, as above.
Aortic Atherosclerosis ([id number]).

## 2018-09-09 ENCOUNTER — Encounter: Payer: Self-pay | Admitting: *Deleted

## 2018-09-09 ENCOUNTER — Emergency Department: Payer: Medicare Other

## 2018-09-09 ENCOUNTER — Inpatient Hospital Stay
Admission: EM | Admit: 2018-09-09 | Discharge: 2018-09-15 | DRG: 481 | Disposition: A | Discharge status: Skilled Nursing Facility | Payer: Medicare Other | Attending: Orthopedic Surgery | Admitting: Orthopedic Surgery

## 2018-09-09 ENCOUNTER — Other Ambulatory Visit: Payer: Self-pay

## 2018-09-09 DIAGNOSIS — J449 Chronic obstructive pulmonary disease, unspecified: Secondary | ICD-10-CM | POA: Diagnosis present

## 2018-09-09 DIAGNOSIS — W1830XA Fall on same level, unspecified, initial encounter: Secondary | ICD-10-CM | POA: Diagnosis present

## 2018-09-09 DIAGNOSIS — E785 Hyperlipidemia, unspecified: Secondary | ICD-10-CM | POA: Diagnosis present

## 2018-09-09 DIAGNOSIS — I10 Essential (primary) hypertension: Secondary | ICD-10-CM | POA: Diagnosis present

## 2018-09-09 DIAGNOSIS — F259 Schizoaffective disorder, unspecified: Secondary | ICD-10-CM | POA: Diagnosis present

## 2018-09-09 DIAGNOSIS — Z8349 Family history of other endocrine, nutritional and metabolic diseases: Secondary | ICD-10-CM

## 2018-09-09 DIAGNOSIS — M978XXA Periprosthetic fracture around other internal prosthetic joint, initial encounter: Secondary | ICD-10-CM

## 2018-09-09 DIAGNOSIS — Z419 Encounter for procedure for purposes other than remedying health state, unspecified: Secondary | ICD-10-CM

## 2018-09-09 DIAGNOSIS — S72002A Fracture of unspecified part of neck of left femur, initial encounter for closed fracture: Secondary | ICD-10-CM

## 2018-09-09 DIAGNOSIS — M9702XA Periprosthetic fracture around internal prosthetic left hip joint, initial encounter: Secondary | ICD-10-CM | POA: Diagnosis present

## 2018-09-09 DIAGNOSIS — E7800 Pure hypercholesterolemia, unspecified: Secondary | ICD-10-CM | POA: Diagnosis present

## 2018-09-09 DIAGNOSIS — F17210 Nicotine dependence, cigarettes, uncomplicated: Secondary | ICD-10-CM | POA: Diagnosis present

## 2018-09-09 DIAGNOSIS — Z96649 Presence of unspecified artificial hip joint: Secondary | ICD-10-CM

## 2018-09-09 DIAGNOSIS — M159 Polyosteoarthritis, unspecified: Secondary | ICD-10-CM | POA: Diagnosis present

## 2018-09-09 DIAGNOSIS — S72302A Unspecified fracture of shaft of left femur, initial encounter for closed fracture: Secondary | ICD-10-CM | POA: Diagnosis present

## 2018-09-09 DIAGNOSIS — K219 Gastro-esophageal reflux disease without esophagitis: Secondary | ICD-10-CM | POA: Diagnosis present

## 2018-09-09 DIAGNOSIS — G8929 Other chronic pain: Secondary | ICD-10-CM | POA: Diagnosis present

## 2018-09-09 DIAGNOSIS — G8918 Other acute postprocedural pain: Secondary | ICD-10-CM

## 2018-09-09 LAB — BASIC METABOLIC PANEL
Anion gap: 6 (ref 5–15)
BUN: 9 mg/dL (ref 6–20)
CALCIUM: 8.1 mg/dL — AB (ref 8.9–10.3)
CO2: 28 mmol/L (ref 22–32)
CREATININE: 0.88 mg/dL (ref 0.61–1.24)
Chloride: 97 mmol/L — ABNORMAL LOW (ref 98–111)
GFR calc Af Amer: >60 mL/min (ref >60)
GFR calc non Af Amer: >60 mL/min (ref >60)
GLUCOSE: 97 mg/dL (ref 70–99)
Potassium: 4.6 mmol/L (ref 3.5–5.1)
Sodium: 131 mmol/L — ABNORMAL LOW (ref 135–145)

## 2018-09-09 LAB — PROTIME-INR
INR: 1.03
Prothrombin Time: 13.4 seconds (ref 11.4–15.2)

## 2018-09-09 LAB — CBC WITH DIFFERENTIAL/PLATELET
Abs Immature Granulocytes: 0.11 10*3/uL — ABNORMAL HIGH (ref 0.00–0.07)
BASOS ABS: 0 10*3/uL (ref 0.0–0.1)
BASOS PCT: 0 %
EOS ABS: 0.2 10*3/uL (ref 0.0–0.5)
EOS PCT: 2 %
HEMATOCRIT: 39.6 % (ref 39.0–52.0)
Hemoglobin: 13.2 g/dL (ref 13.0–17.0)
Immature Granulocytes: 1 %
LYMPHS ABS: 1.9 10*3/uL (ref 0.7–4.0)
Lymphocytes Relative: 15 %
MCH: 28.9 pg (ref 26.0–34.0)
MCHC: 33.3 g/dL (ref 30.0–36.0)
MCV: 86.7 fL (ref 80.0–100.0)
MONOS PCT: 6 %
Monocytes Absolute: 0.8 10*3/uL (ref 0.1–1.0)
NRBC: 0 % (ref 0.0–0.2)
Neutro Abs: 9.9 10*3/uL — ABNORMAL HIGH (ref 1.7–7.7)
Neutrophils Relative %: 76 %
Platelets: 241 10*3/uL (ref 150–400)
RBC: 4.57 MIL/uL (ref 4.22–5.81)
RDW: 13.1 % (ref 11.5–15.5)
WBC: 13 10*3/uL — ABNORMAL HIGH (ref 4.0–10.5)

## 2018-09-09 LAB — APTT: aPTT: 31 seconds (ref 24–36)

## 2018-09-09 MED ORDER — ZOLPIDEM TARTRATE 5 MG PO TABS: 5.0000 mg | ORAL_TABLET | Freq: Every evening | ORAL | Status: DC | PRN

## 2018-09-09 MED ORDER — MAGNESIUM HYDROXIDE 400 MG/5ML PO SUSP
30.0000 mL | Freq: Every day | ORAL | Status: DC | PRN
  Administered 2018-09-13: 30 mL via ORAL
  Filled 2018-09-09: qty 30

## 2018-09-09 MED ORDER — MORPHINE SULFATE (PF) 4 MG/ML IV SOLN
4.0000 mg | Freq: Once | INTRAVENOUS | Status: AC
  Administered 2018-09-09: 4 mg via INTRAVENOUS
  Filled 2018-09-09: qty 1

## 2018-09-09 MED ORDER — METHOCARBAMOL 1000 MG/10ML IJ SOLN
500.0000 mg | Freq: Four times a day (QID) | INTRAVENOUS | Status: DC | PRN
  Filled 2018-09-09: qty 5

## 2018-09-09 MED ORDER — ONDANSETRON HCL 4 MG/2ML IJ SOLN
4.0000 mg | Freq: Once | INTRAMUSCULAR | Status: AC
  Administered 2018-09-09: 4 mg via INTRAVENOUS
  Filled 2018-09-09: qty 2

## 2018-09-09 MED ORDER — CEFAZOLIN SODIUM-DEXTROSE 2-4 GM/100ML-% IV SOLN
2.0000 g | Freq: Once | INTRAVENOUS | Status: AC
  Administered 2018-09-12: 3 g via INTRAVENOUS
  Filled 2018-09-09: qty 100

## 2018-09-09 MED ORDER — HYDROMORPHONE HCL 1 MG/ML IJ SOLN
1.0000 mg | Freq: Once | INTRAMUSCULAR | Status: AC
  Administered 2018-09-09: 1 mg via INTRAVENOUS
  Filled 2018-09-09: qty 1

## 2018-09-09 MED ORDER — METHOCARBAMOL 500 MG PO TABS
500.0000 mg | ORAL_TABLET | Freq: Four times a day (QID) | ORAL | Status: DC | PRN
  Administered 2018-09-09 – 2018-09-10 (×4): 500 mg via ORAL
  Filled 2018-09-09 (×4): qty 1

## 2018-09-09 MED ORDER — DOCUSATE SODIUM 100 MG PO CAPS
100.0000 mg | ORAL_CAPSULE | Freq: Two times a day (BID) | ORAL | Status: DC
  Administered 2018-09-10 – 2018-09-15 (×9): 100 mg via ORAL
  Filled 2018-09-09 (×10): qty 1

## 2018-09-09 MED ORDER — SODIUM CHLORIDE 0.9 % IV SOLN
INTRAVENOUS | Status: DC
  Administered 2018-09-09 – 2018-09-11 (×3): via INTRAVENOUS

## 2018-09-09 MED ORDER — MORPHINE SULFATE (PF) 2 MG/ML IV SOLN
0.5000 mg | INTRAVENOUS | Status: DC | PRN
  Administered 2018-09-09 – 2018-09-10 (×3): 0.5 mg via INTRAVENOUS
  Filled 2018-09-09 (×3): qty 1

## 2018-09-09 MED ORDER — HYDROCODONE-ACETAMINOPHEN 5-325 MG PO TABS
1.0000 | ORAL_TABLET | Freq: Four times a day (QID) | ORAL | Status: DC | PRN
  Administered 2018-09-09 – 2018-09-10 (×3): 1 via ORAL
  Filled 2018-09-09 (×4): qty 1

## 2018-09-09 NOTE — ED Triage Notes (Signed)
Pt fell on L side, hx of hip replacement on L x 8 yrs ago. Pt unable to straighten L leg. Pt denies other injury, has several abrasions to bilateral elbows. Pt given Fentanyl 100 mcg en route by EMs.

## 2018-09-09 NOTE — ED Provider Notes (Signed)
[facility] Emergency Department Provider Note  ____________________________________________   First MD Initiated Contact with Patient 09/09/18 1957     (approximate)  I have reviewed the triage vital signs and the nursing notes.   HISTORY  Chief Complaint Hip Pain and Fall   HPI Richard Davis is a 43 y.o. male with a history of a left-sided hip fracture who is presented emergency department for falling onto his left hip.  Patient says that he was eating skittles and started choking on the skills and then fell onto his left hip.  Denies hitting his head or losing consciousness.   Past Medical History:  Diagnosis Date  . Arthritis    hands, foot, arm  . COPD (chronic obstructive pulmonary disease) (HCC)   . Depression   . GERD (gastroesophageal reflux disease)   . Hypercholesteremia   . Hypertension     Patient Active Problem List   Diagnosis Date Noted  . Periprosthetic fracture around internal prosthetic hip joint 09/09/2018  . Avascular necrosis (HCC) 12/07/2016  . Leg pain 10/23/2016  . Left hip pain 10/05/2016  . Hypogonadism in male 03/27/2016  . Surgical complication involving left eye associated with non-ophthalmic procedure 06/17/2015  . Hx of cardiac catheterization 02/03/2015  . 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 Chest pain with high risk for cardiac etiology 01/27/2015  . Abnormal nuclear stress test 01/27/2015  . Chronic pain 01/27/2015  . GERD (gastroesophageal reflux disease) 01/27/2015  . Hyperlipidemia 01/27/2015  . Hypertension 01/27/2015  . Joint pain 01/27/2015  . Sinusitis 01/27/2015  . Tobacco use 01/27/2015  . SOB (shortness of breath) on exertion 12/31/2014  . Gynecomastia 02/10/2013  . Schizoaffective disorder (HCC) 06/04/2012    Past Surgical History:  Procedure Laterality Date  . CARDIAC CATHETERIZATION N/A 01/27/2015   Procedure: Left Heart Cath;  Surgeon: Marcina Millard, MD;   Location: [facility] INVASIVE CV LAB;  Service: Cardiovascular;  Laterality: N/A;  . ETHMOIDECTOMY Left 06/17/2015   Procedure: ETHMOIDECTOMY REVISION and removal of polyps.;  Surgeon: Vernie Murders, MD;  Location: [facility] SURGERY [facility];  Service: ENT;  Laterality: Left;  . FRONTAL SINUS EXPLORATION Left 06/17/2015   Procedure: FRONTAL Sinusotomy with removal of polyps.;  Surgeon: Vernie Murders, MD;  Location: [facility]ERY [facility];  Service: ENT;  Laterality: Left;  . IMAGE GUIDED SINUS SURGERY N/A 06/17/2015   Procedure: IMAGE GUIDED SINUS SURGERY;  Surgeon: Vernie Murders, MD;  Location: [facility] SUR[facility]ERY [facility];  Service: ENT;  Laterality: N/A;  GAVE DISK TO [name] PROPEL Gave 2nd disk to cece 10-24 kp  . JOINT REPLACEMENT    . MAXILLARY ANTROSTOMY Left 06/17/2015   Procedure: ENDOSCOPIC MAXILLARY ANTROSTOMY WITH REMOVAL CONTENTS;  Surgeon: Vernie Murders, MD;  Location: [facility]ERY [facility];  Service: ENT;  Laterality: Left;  . POLYPECTOMY Left 06/17/2015   Procedure: POLYPECTOMY NASAL;  Surgeon: Vernie Murders, MD;  Location: [facility]ERY [facility];  Service: ENT;  Laterality: Left;  . TONSILLECTOMY      Prior to Admission medications   Medication Sig Start Date End Date Taking? Authorizing Provider  amitriptyline (ELAVIL) 100 MG tablet Take 50-100 mg by mouth at bedtime.    Yes [provider]  B-COMPLEX-C PO Take 1 tablet by mouth daily.   Yes [provider]  gabapentin (NEURONTIN) 300 MG capsule Take 300 mg by mouth 3 (three) times daily.   Yes [provider]  haloperidol (HALDOL) 10 MG tablet Take 5-10 mg by mouth 3 (three) times daily. Take one  tablet by mouth every morning, and one-half tablet every afternoon and one tablet every night at bedtime   Yes [provider]  lisinopril-hydrochlorothiazide (PRINZIDE,ZESTORETIC) 20-12.5 MG tablet Take 1 tablet by mouth daily.   Yes [provider]  METHADONE HCL PO Take 146 mg by mouth daily.   Yes [provider]  omeprazole (PRILOSEC) 40 MG capsule Take 40 mg by mouth daily. 12/05/17 12/06/18 Yes [provider]  QUEtiapine (SEROQUEL XR) 400 MG 24 hr tablet 400 mg 2 (two) times daily.  03/13/17  Yes [provider]  simvastatin (ZOCOR) 40 MG tablet Take 40 mg by mouth daily.   Yes [provider]  traZODone (DESYREL) 100 MG tablet Take 300-400 mg by mouth at bedtime.    Yes [provider]  ADVAIR [facility] 767-20 MCG/ACT inhaler USE 2 INHALATIONS BID 03/30/17   [provider]  albuterol (PROVENTIL HFA;VENTOLIN HFA) 108 (90 BASE) MCG/ACT inhaler Inhale 2 puffs into the lungs every 6 (six) hours as needed for wheezing or shortness of breath.    [provider]  amoxicillin-clavulanate (AUGMENTIN) 875-125 MG tablet Take 1 tablet by mouth every 12 (twelve) hours. Patient not taking: Reported on 09/09/2018 04/14/18   Lutricia Feil, PA-C  HYDROcodone-acetaminophen (NORCO/VICODIN) 5-325 MG tablet Take 1-2 tablets by mouth every 6 (six) hours as needed. Patient not taking: Reported on 09/09/2018 04/14/18   Lutricia Feil, PA-C  sodium chloride (OCEAN) 0.65 % SOLN nasal spray Place 2 sprays into both nostrils every 2 (two) hours while awake. 01/18/18 02/17/18  Betancourt, Jarold Song, NP    Allergies Patient has no known allergies.  Family History  Problem Relation Age of Onset  . Hypercholesterolemia Mother   . Crohn's disease Father     Social History Social History   Tobacco Use  . Smoking status: Current Every Day Smoker    Packs/day: 2.00    Years: 15.00    Pack years: 30.00    Types: Cigarettes  . Smokeless tobacco: Never Used  Substance Use Topics  . Alcohol use: No  . Drug use: No    Review of Systems  Constitutional: No fever/chills Eyes: No visual changes. ENT: No sore throat. Cardiovascular: Denies chest pain. Respiratory: Denies shortness of breath. Gastrointestinal: No abdominal pain.  No nausea, no vomiting.  No diarrhea.  No  constipation. Genitourinary: Negative for dysuria. Musculoskeletal: Negative for back pain. Skin: Negative for rash. Neurological: Negative for headaches, focal weakness or numbness.   ____________________________________________   PHYSICAL EXAM:  VITAL SIGNS: ED Triage Vitals  Enc Vitals Group     BP 09/09/18 1840 134/88     Pulse Rate 09/09/18 1840 (!) 101     Resp 09/09/18 1840 (!) 21     Temp 09/09/18 1840 98.2 F (36.8 C)     Temp Source 09/09/18 1840 Oral     SpO2 09/09/18 1835 94 %     Weight 09/09/18 1843 225 lb (102.1 kg)     Height 09/09/18 1843 5\' 11"  (1.803 m)     Head Circumference --      Peak Flow --      Pain Score 09/09/18 1842 10     Pain Loc --      Pain Edu? --      Excl. in GC? --     Constitutional: Alert and oriented.  Patient appears uncomfortable.  Asking for pain meds. Eyes: Conjunctivae are normal.  Head: Atraumatic. Nose: No congestion/rhinnorhea. Mouth/Throat: Mucous membranes are  moist.  Neck: No stridor.   Cardiovascular: Normal rate, regular rhythm. Grossly normal heart sounds.  Respiratory: Normal respiratory effort.  No retractions. Lungs CTAB. Gastrointestinal: Soft and nontender. No distention. Musculoskeletal:   Patient with tenderness to the left hip anteriorly as well as laterally.  Patient with left lower extremity propped up under the left thigh for comfort.  Distally the patient is neurovascular intact with sensation intact light touch as well as intact dorsalis pedis pulse.  Neurologic:  Normal speech and language. No gross focal neurologic deficits are appreciated. Skin:  Skin is warm, dry and intact. No rash noted. Psychiatric: Mood and affect are normal. Speech and behavior are normal.  ____________________________________________   LABS (all labs ordered are listed, but only abnormal results are displayed)  Labs Reviewed  CBC WITH DIFFERENTIAL/PLATELET - Abnormal; Notable for the following components:      Result  Value   WBC 13.0 (*)    Neutro Abs 9.9 (*)    Abs Immature Granulocytes 0.11 (*)    All other components within normal limits  BASIC METABOLIC PANEL - Abnormal; Notable for the following components:   Sodium 131 (*)    Chloride 97 (*)    Calcium 8.1 (*)    All other components within normal limits  PROTIME-INR  APTT  HIV ANTIBODY (ROUTINE TESTING W REFLEX)  TYPE AND SCREEN   ____________________________________________  EKG  ED ECG REPORT I, Arelia Longestavid M , the attending physician, personally viewed and interpreted this ECG.   Date: 09/09/2018  EKG Time: 1922  Rate: 102  Rhythm: sinus tachycardia  Axis: normal  Intervals: Prolonged QTC at 503  ST&T Change: No ST segment elevation or depression.  No abnormal T wave inversion.  ____________________________________________  RADIOLOGY  Chest x-ray with chronic scarring and pleural thickening.  No active disease.  Left hip x-ray with acute posttraumatic Subco trochanteric fracture of the proximal left femur. ____________________________________________   PROCEDURES  Procedure(s) performed:   Procedures  Critical Care performed:   ____________________________________________   INITIAL IMPRESSION / ASSESSMENT AND PLAN / ED COURSE  Pertinent labs & imaging results that were available during my care of the patient were reviewed by me and considered in my medical decision making (see chart for details).  DDX: Mechanical fall, syncope, left hip fracture, left hip contusion As part of my medical decision making, I reviewed the following data within the electronic MEDICAL RECORD NUMBER Notes from prior ED visits  ----------------------------------------- 8:36 PM on 09/09/2018 -----------------------------------------  Discussed the case with Dr. Rosita KeaMenz of orthopedics who says that he will accept the patient onto her service primarily and says that he will repair the hip.  Patient aware of need for admission to the  hospital.  He is understanding the diagnosis well treatment and willing to comply.  Showed the image of the x-ray to both the patient and his mother at bedside.  ____________________________________________   FINAL CLINICAL IMPRESSION(S) / ED DIAGNOSES  Left-sided hip fracture.  NEW MEDICATIONS STARTED DURING THIS VISIT:  New Prescriptions   No medications on file     Note:  This document was prepared using Dragon voice recognition software and may include unintentional dictation errors.     Myrna Blazer,  Matthew, MD 09/09/18 2036

## 2018-09-09 NOTE — ED Notes (Signed)
Report was called and given.  

## 2018-09-10 ENCOUNTER — Encounter
Admission: EM | Disposition: A | Discharge status: Skilled Nursing Facility | Payer: Self-pay | Source: Home / Self Care | Attending: Orthopedic Surgery

## 2018-09-10 ENCOUNTER — Encounter: Payer: Self-pay | Admitting: Certified Registered"

## 2018-09-10 LAB — URINE DRUG SCREEN, QUALITATIVE (ARMC ONLY)
Amphetamines, Ur Screen: NOT DETECTED
Barbiturates, Ur Screen: NOT DETECTED
Benzodiazepine, Ur Scrn: NOT DETECTED
Cannabinoid 50 Ng, Ur ~~LOC~~: NOT DETECTED
Cocaine Metabolite,Ur ~~LOC~~: POSITIVE — AB
MDMA (ECSTASY) UR SCREEN: NOT DETECTED
Methadone Scn, Ur: POSITIVE — AB
Opiate, Ur Screen: POSITIVE — AB
Phencyclidine (PCP) Ur S: NOT DETECTED
Tricyclic, Ur Screen: POSITIVE — AB

## 2018-09-10 LAB — SURGICAL PCR SCREEN
MRSA, PCR: NEGATIVE
Staphylococcus aureus: NEGATIVE

## 2018-09-10 LAB — TYPE AND SCREEN
ABO/RH(D): A NEG
ANTIBODY SCREEN: NEGATIVE

## 2018-09-10 SURGERY — OPEN REDUCTION INTERNAL FIXATION HIP
Anesthesia: Choice | Laterality: Left

## 2018-09-10 MED ORDER — MOMETASONE FURO-FORMOTEROL FUM 200-5 MCG/ACT IN AERO
2.0000 | INHALATION_SPRAY | Freq: Two times a day (BID) | RESPIRATORY_TRACT | Status: DC
  Administered 2018-09-10 – 2018-09-15 (×7): 2 via RESPIRATORY_TRACT
  Filled 2018-09-10 (×2): qty 8.8

## 2018-09-10 MED ORDER — GABAPENTIN 300 MG PO CAPS
300.0000 mg | ORAL_CAPSULE | Freq: Three times a day (TID) | ORAL | Status: DC
  Administered 2018-09-10 – 2018-09-15 (×12): 300 mg via ORAL
  Filled 2018-09-10 (×13): qty 1

## 2018-09-10 MED ORDER — SIMVASTATIN 20 MG PO TABS
40.0000 mg | ORAL_TABLET | Freq: Every day | ORAL | Status: DC
  Administered 2018-09-11 – 2018-09-15 (×5): 40 mg via ORAL
  Filled 2018-09-10 (×5): qty 2

## 2018-09-10 MED ORDER — HALOPERIDOL 5 MG PO TABS
10.0000 mg | ORAL_TABLET | Freq: Every day | ORAL | Status: DC
  Administered 2018-09-10 – 2018-09-14 (×4): 10 mg via ORAL
  Filled 2018-09-10 (×6): qty 2

## 2018-09-10 MED ORDER — HALOPERIDOL 5 MG PO TABS
5.0000 mg | ORAL_TABLET | Freq: Every day | ORAL | Status: DC
  Administered 2018-09-10 – 2018-09-13 (×4): 5 mg via ORAL
  Filled 2018-09-10 (×6): qty 1

## 2018-09-10 MED ORDER — LISINOPRIL-HYDROCHLOROTHIAZIDE 20-12.5 MG PO TABS: 1.0000 | ORAL_TABLET | Freq: Every day | ORAL | Status: DC

## 2018-09-10 MED ORDER — B COMPLEX-C PO TABS
1.0000 | ORAL_TABLET | Freq: Every day | ORAL | Status: DC
  Administered 2018-09-11 – 2018-09-15 (×4): 1 via ORAL
  Filled 2018-09-10 (×6): qty 1

## 2018-09-10 MED ORDER — OXYCODONE HCL ER 15 MG PO T12A: 15.0000 mg | EXTENDED_RELEASE_TABLET | Freq: Two times a day (BID) | ORAL | Status: DC

## 2018-09-10 MED ORDER — B-COMPLEX-C PO TABS: 1.0000 | ORAL_TABLET | Freq: Every day | ORAL | Status: DC

## 2018-09-10 MED ORDER — QUETIAPINE FUMARATE ER 200 MG PO TB24: 400.0000 mg | ORAL_TABLET | Freq: Two times a day (BID) | ORAL | Status: DC

## 2018-09-10 MED ORDER — PANTOPRAZOLE SODIUM 40 MG PO TBEC
40.0000 mg | DELAYED_RELEASE_TABLET | Freq: Every day | ORAL | Status: DC
  Administered 2018-09-11 – 2018-09-15 (×5): 40 mg via ORAL
  Filled 2018-09-10 (×5): qty 1

## 2018-09-10 MED ORDER — LISINOPRIL 20 MG PO TABS
20.0000 mg | ORAL_TABLET | Freq: Every day | ORAL | Status: DC
  Administered 2018-09-11 – 2018-09-15 (×5): 20 mg via ORAL
  Filled 2018-09-10 (×5): qty 1

## 2018-09-10 MED ORDER — MORPHINE SULFATE 15 MG PO TABS
15.0000 mg | ORAL_TABLET | Freq: Three times a day (TID) | ORAL | Status: DC
  Administered 2018-09-10 – 2018-09-14 (×11): 15 mg via ORAL
  Filled 2018-09-10 (×11): qty 1

## 2018-09-10 MED ORDER — HYDROCHLOROTHIAZIDE 12.5 MG PO CAPS
12.5000 mg | ORAL_CAPSULE | Freq: Every day | ORAL | Status: DC
  Administered 2018-09-11 – 2018-09-15 (×5): 12.5 mg via ORAL
  Filled 2018-09-10 (×5): qty 1

## 2018-09-10 MED ORDER — ALBUTEROL SULFATE (2.5 MG/3ML) 0.083% IN NEBU: 3.0000 mL | INHALATION_SOLUTION | Freq: Four times a day (QID) | RESPIRATORY_TRACT | Status: DC | PRN

## 2018-09-10 MED ORDER — QUETIAPINE FUMARATE ER 200 MG PO TB24
400.0000 mg | ORAL_TABLET | Freq: Two times a day (BID) | ORAL | Status: DC
  Administered 2018-09-10 – 2018-09-15 (×9): 400 mg via ORAL
  Filled 2018-09-10 (×13): qty 2

## 2018-09-10 MED ORDER — TRAZODONE HCL 100 MG PO TABS
300.0000 mg | ORAL_TABLET | Freq: Every day | ORAL | Status: DC
  Administered 2018-09-10: 400 mg via ORAL
  Filled 2018-09-10: qty 4

## 2018-09-10 MED ORDER — NICOTINE 21 MG/24HR TD PT24
21.0000 mg | MEDICATED_PATCH | Freq: Every day | TRANSDERMAL | Status: DC
  Administered 2018-09-10 – 2018-09-15 (×6): 21 mg via TRANSDERMAL
  Filled 2018-09-10 (×6): qty 1

## 2018-09-10 MED ORDER — METHADONE HCL 10 MG/ML PO CONC
146.0000 mg | Freq: Every day | ORAL | Status: DC
  Administered 2018-09-10 – 2018-09-15 (×6): 146 mg via ORAL
  Filled 2018-09-10 (×4): qty 14.6
  Filled 2018-09-10: qty 15
  Filled 2018-09-10: qty 14.6

## 2018-09-10 MED ORDER — HYDROCODONE-ACETAMINOPHEN 5-325 MG PO TABS
2.0000 | ORAL_TABLET | Freq: Four times a day (QID) | ORAL | Status: DC | PRN
  Administered 2018-09-10 – 2018-09-15 (×12): 2 via ORAL
  Filled 2018-09-10 (×12): qty 2

## 2018-09-10 MED ORDER — HALOPERIDOL 5 MG PO TABS
5.0000 mg | ORAL_TABLET | Freq: Three times a day (TID) | ORAL | Status: DC
  Filled 2018-09-10 (×3): qty 2

## 2018-09-10 MED ORDER — AMITRIPTYLINE HCL 25 MG PO TABS
50.0000 mg | ORAL_TABLET | Freq: Every day | ORAL | Status: DC
  Administered 2018-09-10 – 2018-09-14 (×4): 50 mg via ORAL
  Filled 2018-09-10 (×5): qty 2

## 2018-09-10 MED ORDER — HALOPERIDOL 5 MG PO TABS
10.0000 mg | ORAL_TABLET | Freq: Every day | ORAL | Status: DC
  Administered 2018-09-11 – 2018-09-15 (×5): 10 mg via ORAL
  Filled 2018-09-10 (×5): qty 2

## 2018-09-10 SURGICAL SUPPLY — 36 items
CANISTER SUCT 1200ML W/VALVE (MISCELLANEOUS) ×3 IMPLANT
CANISTER SUCT 3000ML PPV (MISCELLANEOUS) ×6 IMPLANT
CHLORAPREP W/TINT 26ML (MISCELLANEOUS) ×3 IMPLANT
COVER WAND RF STERILE (DRAPES) ×3 IMPLANT
DRAPE C-ARM XRAY 36X54 (DRAPES) ×3 IMPLANT
DRAPE C-ARMOR (DRAPES) ×3 IMPLANT
DRAPE INCISE IOBAN 66X60 STRL (DRAPES) ×6 IMPLANT
DRAPE SHEET LG 3/4 BI-LAMINATE (DRAPES) ×6 IMPLANT
DRESSING SURGICEL FIBRLLR 1X2 (HEMOSTASIS) ×2 IMPLANT
DRSG SURGICEL FIBRILLAR 1X2 (HEMOSTASIS) ×6
ELECT BLADE 6.5 EXT (BLADE) ×3 IMPLANT
ELECT CAUTERY BLADE 6.4 (BLADE) ×3 IMPLANT
ELECT REM PT RETURN 9FT ADLT (ELECTROSURGICAL) ×3
ELECTRODE REM PT RTRN 9FT ADLT (ELECTROSURGICAL) ×1 IMPLANT
GLOVE SURG SYN 9.0  PF PI (GLOVE) ×4
GLOVE SURG SYN 9.0 PF PI (GLOVE) ×2 IMPLANT
GOWN SRG 2XL LVL 4 RGLN SLV (GOWNS) ×1 IMPLANT
GOWN STRL NON-REIN 2XL LVL4 (GOWNS) ×2
GOWN STRL REUS W/ TWL LRG LVL3 (GOWN DISPOSABLE) ×1 IMPLANT
GOWN STRL REUS W/TWL LRG LVL3 (GOWN DISPOSABLE) ×2
HEMOSTAT SURGICEL 2X3 (HEMOSTASIS) IMPLANT
HOLDER FOLEY CATH W/STRAP (MISCELLANEOUS) ×3 IMPLANT
HOOD PEEL AWAY FLYTE STAYCOOL (MISCELLANEOUS) ×3 IMPLANT
KIT TURNOVER KIT A (KITS) ×3 IMPLANT
NDL SAFETY ECLIPSE 18X1.5 (NEEDLE) ×1 IMPLANT
NEEDLE HYPO 18GX1.5 SHARP (NEEDLE) ×2
NS IRRIG 500ML POUR BTL (IV SOLUTION) ×3 IMPLANT
PACK HIP PROSTHESIS (MISCELLANEOUS) ×3 IMPLANT
STAPLER SKIN PROX 35W (STAPLE) ×3 IMPLANT
SUT DVC 2 QUILL PDO  T11 36X36 (SUTURE) ×2
SUT DVC 2 QUILL PDO T11 36X36 (SUTURE) ×1 IMPLANT
SUT V-LOC 90 ABS DVC 3-0 CL (SUTURE) ×3 IMPLANT
SUT VIC AB 1 CT1 36 (SUTURE) ×3 IMPLANT
SYR 20CC LL (SYRINGE) ×3 IMPLANT
TOWEL OR 17X26 4PK STRL BLUE (TOWEL DISPOSABLE) ×3 IMPLANT
TRAY FOLEY MTR SLVR 16FR STAT (SET/KITS/TRAYS/PACK) IMPLANT

## 2018-09-10 NOTE — [place] FL2 (Signed)
[place] MEDICAID FL2 LEVEL OF CARE SCREENING TOOL     IDENTIFICATION  Patient Name: Richard CoverMichael B Davis Birthdate: [birth year] Sex: male Admission Date (Current Location): 09/09/2018  [facility] [place]  [place] ([id number] [facility] Facility and Address:  [facility], [address], [place], [place] [address]      Provider Number: [id number]  Attending Physician Name and Address:  Kennedy BuckerMenz, Aadin, MD  Relative Name and Phone Number:       Current Level of Care: Hospital Recommended Level of Care: Skilled Nursing Facility Prior Approval Number:    Date Approved/Denied:   PASRR Number:    Discharge Plan: SNF    Current Diagnoses: Patient Active Problem List   Diagnosis Date Noted  . Periprosthetic fracture around internal prosthetic hip joint 09/09/2018  . Avascular necrosis (HCC) 12/07/2016  . Leg pain 10/23/2016  . Left hip pain 10/05/2016  . Hypogonadism in male 03/27/2016  . Surgical complication involving left eye associated with non-ophthalmic procedure 06/17/2015  . Hx of cardiac catheterization 02/03/2015  . 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 Chest pain with high risk for cardiac etiology 01/27/2015  . Abnormal nuclear stress test 01/27/2015  . Chronic pain 01/27/2015  . GERD (gastroesophageal reflux disease) 01/27/2015  . Hyperlipidemia 01/27/2015  . Hypertension 01/27/2015  . Joint pain 01/27/2015  . Sinusitis 01/27/2015  . Tobacco use 01/27/2015  . SOB (shortness of breath) on exertion 12/31/2014  . Gynecomastia 02/10/2013  . Schizoaffective disorder (HCC) 06/04/2012    Orientation RESPIRATION BLADDER Height & Weight     Self, Time, Situation, Place  Normal Continent Weight: 225 lb (102.1 kg) Height:  5\' 11"  (180.3 cm)  BEHAVIORAL SYMPTOMS/MOOD NEUROLOGICAL BOWEL NUTRITION STATUS      Continent Diet(Diet: NPO for surgery to be advanced. )  AMBULATORY STATUS COMMUNICATION OF NEEDS Skin   Extensive  Assist Verbally Surgical wounds                       Personal Care Assistance Level of Assistance  Bathing, Feeding, Dressing Bathing Assistance: Limited assistance Feeding assistance: Independent Dressing Assistance: Limited assistance     Functional Limitations Info  Sight, Hearing, Speech Sight Info: Adequate Hearing Info: Adequate Speech Info: Adequate    SPECIAL CARE FACTORS FREQUENCY  PT (By licensed PT), OT (By licensed OT)     PT Frequency: (5) OT Frequency: (5)            Contractures      Additional Factors Info  Code Status, Allergies Code Status Info: (Full Code. ) Allergies Info: (No Known Allergies. )           Current Medications (09/10/2018):  This is the current hospital active medication list Current Facility-Administered Medications  Medication Dose Route Frequency Provider Last Rate Last Dose  . 0.9 %  sodium chloride infusion   Intravenous Continuous Kennedy BuckerMenz, Pesach, MD 75 mL/hr at 09/10/18 [id number]    . albuterol (PROVENTIL) (2.5 MG/3ML) 0.083% nebulizer solution 3 mL  3 mL Inhalation Q6H PRN Kennedy BuckerMenz, Limuel, MD      . amitriptyline (ELAVIL) tablet 50-100 mg  50-100 mg Oral QHS Kennedy BuckerMenz, Yerik, MD      . B-complex with vitamin C tablet 1 tablet  1 tablet Oral Daily Kennedy BuckerMenz, Nicklous, MD      . ceFAZolin (ANCEF) IVPB 2g/100 mL premix  2 g Intravenous Once Kennedy BuckerMenz, Willi, MD      . docusate sodium (COLACE) capsule 100 mg  100  mg Oral BID Kennedy BuckerMenz, Kenechukwu, MD      . gabapentin (NEURONTIN) capsule 300 mg  300 mg Oral TID Kennedy BuckerMenz, Susan, MD      . haloperidol (HALDOL) tablet 5-10 mg  5-10 mg Oral TID Kennedy BuckerMenz, Isaias, MD      . lisinopril (PRINIVIL,ZESTRIL) tablet 20 mg  20 mg Oral Daily Kennedy BuckerMenz, Jaydenn, MD       And  . hydrochlorothiazide (MICROZIDE) capsule 12.5 mg  12.5 mg Oral Daily Kennedy BuckerMenz, Avin, MD      . HYDROcodone-acetaminophen (NORCO/VICODIN) 5-325 MG per tablet 1 tablet  1 tablet Oral Q6H PRN Kennedy BuckerMenz, Taeshaun, MD   1 tablet at 09/10/18 1125  . magnesium  hydroxide (MILK OF MAGNESIA) suspension 30 mL  30 mL Oral Daily PRN Kennedy BuckerMenz, Leslee, MD      . methadone (DOLOPHINE) 10 MG/ML solution 146 mg  146 mg Oral Daily Kennedy BuckerMenz, Sahaj, MD   146 mg at 09/10/18 0908  . methocarbamol (ROBAXIN) tablet 500 mg  500 mg Oral Q6H PRN Kennedy BuckerMenz, Ismar, MD   500 mg at 09/10/18 1125   Or  . methocarbamol (ROBAXIN) 500 mg in dextrose 5 % 50 mL IVPB  500 mg Intravenous Q6H PRN Kennedy BuckerMenz, Aeron, MD      . mometasone-formoterol [facility](DULERA) 200-5 MCG/ACT inhaler 2 puff  2 puff Inhalation BID Kennedy BuckerMenz, Boone, MD   2 puff at 09/10/18 0910  . morphine 2 MG/ML injection 0.5 mg  0.5 mg Intravenous Q2H PRN Kennedy BuckerMenz, Jakeb, MD   0.5 mg at 09/10/18 0742  . nicotine (NICODERM CQ - dosed in mg/24 hours) patch 21 mg  21 mg Transdermal Daily Montez MoritaPatel, Jitendra, MD   21 mg at 09/10/18 0159  . pantoprazole (PROTONIX) EC tablet 40 mg  40 mg Oral Daily Kennedy BuckerMenz, Chirag, MD      . QUEtiapine (SEROQUEL XR) 24 hr tablet 400 mg  400 mg Oral BID Hugelmeyer, Alexis, DO   400 mg at 09/10/18 0155  . simvastatin (ZOCOR) tablet 40 mg  40 mg Oral Daily Kennedy BuckerMenz, Melo, MD      . traZODone (DESYREL) tablet 300-400 mg  300-400 mg Oral QHS Kennedy BuckerMenz, Trequan, MD      . zolpidem (AMBIEN) tablet 5 mg  5 mg Oral QHS PRN,MR X 1 Kennedy BuckerMenz, Penn, MD       Facility-Administered Medications Ordered in Other Encounters  Medication Dose Route Frequency Provider Last Rate Last Dose  . 0.45 % sodium chloride infusion   Intravenous Continuous Vernie MurdersJuengel, Paul, MD         Discharge Medications: Please see discharge summary for a list of discharge medications.  Relevant Imaging Results:  Relevant Lab Results:   Additional Information (SSN: [id number])  Richard Davis, Darleen CrockerBailey M, LCSW

## 2018-09-10 NOTE — Clinical Social Work Placement (Signed)
   CLINICAL SOCIAL WORK PLACEMENT  NOTE  Date:  09/10/2018  Patient Details  Name: Richard Davis MRN: [id number] Date of Birth: 1976/02/13  Clinical Social Work is seeking post-discharge placement for this patient at the Skilled  Nursing Facility level of care (*CSW will initial, date and re-position this form in  chart as items are completed):  Yes   Patient/family provided with [place] Clinical Social Work Department's list of facilities offering this level of care within the geographic area requested by the patient (or if unable, by the patient's family).  Yes   Patient/family informed of their freedom to choose among providers that offer the needed level of care, that participate in Medicare, Medicaid or managed care program needed by the patient, have an available bed and are willing to accept the patient.  Yes   Patient/family informed of [place]'s ownership interest in [facility] and [facility], as well as of the fact that they are under no obligation to receive care at these facilities.  PASRR submitted to EDS on 09/10/18     PASRR number received on       Existing PASRR number confirmed on       FL2 transmitted to all facilities in geographic area requested by pt/family on 09/10/18     FL2 transmitted to all facilities within larger geographic area on       Patient informed that his/her managed care company has contracts with or will negotiate with certain facilities, including the following:            Patient/family informed of bed offers received.  Patient chooses bed at       Physician recommends and patient chooses bed at      Patient to be transferred to   on  .  Patient to be transferred to facility by       Patient family notified on   of transfer.  Name of family member notified:        PHYSICIAN       Additional Comment:    _______________________________________________ Maddie Brazier, Darleen Crocker, LCSW 09/10/2018, 11:40 AM
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
## 2018-09-10 NOTE — Clinical Social Work Note (Signed)
Clinical Social Work Assessment  Patient Details  Name: Richard Davis MRN: [id number] Date of Birth: 07-Oct-1975  Date of referral:  09/10/18               Reason for consult:  Facility Placement                Permission sought to share information with:  Chartered certified accountant granted to share information::  Yes, Verbal Permission Granted  Name::      [place]::   [place]   Relationship::     Contact Information:     Housing/Transportation Living arrangements for the past 2 months:  [place] of Information:  Patient, Parent Patient Interpreter Needed:  None Criminal Activity/Legal Involvement Pertinent to Current Situation/Hospitalization:  No - Comment as needed Significant Relationships:  Parents Lives with:  Self Do you feel safe going back to the place where you live?  Yes Need for family participation in patient care:  Yes (Comment)  Care giving concerns:  Patient lives alone in [place].    Social Worker assessment / plan:  Holiday representative (CSW) received SNF consult. Patient has a fracture and is going for surgery today with Dr. Rudene Christians. PT is pending. CSW met with patient prior to surgery today and his mother Vaughan Basta [phone] was at bedside. Patient was alert and oriented X4 and was laying in the bed. CSW introduced self and explained role of CSW department. Per patient he lives alone in [place] and does not have a HPOA. Patient reported that his mother lives near him. CSW explained that after surgery PT will evaluate patient and make a recommendation of home health or SNF. CSW explained that medicare requires a 3 night qualifying inpatient hospital stay in order to pay for SNF. Patient was admitted to inpatient 2/3. Patient is agreeable to SNF search in [facility]. Patient is open to SNF or home health depending on his progress. FL2 complete and faxed out. CSW will continue to follow and assist as  neede.   Employment status:  Disabled (Comment on whether or not currently receiving Disability) Insurance information:  Medicare, Medicaid In [place] PT Recommendations:  Not assessed at this time Information / Referral to community resources:  Skilled Nursing Facility(SNF vs. [place])  Patient/Family's Response to care:  Patient is open to SNF or home health.   Patient/Family's Understanding of and Emotional Response to Diagnosis, Current Treatment, and Prognosis:  Patient was very pleasant and thanked CSW for assistance.   Emotional Assessment Appearance:  Appears stated age Attitude/Demeanor/Rapport:    Affect (typically observed):  Accepting, Adaptable, Pleasant Orientation:  Oriented to Self, Oriented to Place, Oriented to  Time, Oriented to Situation Alcohol / Substance use:  Not Applicable Psych involvement (Current and /or in the community):  No (Comment)  Discharge Needs  Concerns to be addressed:  Discharge Planning Concerns Readmission within the last 30 days:  No Current discharge risk:  Dependent with Mobility Barriers to Discharge:  Continued Medical Work up   [employer], Veronia Beets, LCSW 09/10/2018, 11:41 AM
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
## 2018-09-10 NOTE — Progress Notes (Signed)
Pt notified that Dr. Rosita Kea called to say that surgery would be moved to Thursday d/t UDS being positive for cocaine. Pt wanted to tell his mother the reason his surgery was rescheduled-she was updated as well.

## 2018-09-11 LAB — URINE DRUG SCREEN, QUALITATIVE (ARMC ONLY)
Amphetamines, Ur Screen: NOT DETECTED
Barbiturates, Ur Screen: NOT DETECTED
Benzodiazepine, Ur Scrn: NOT DETECTED
Cannabinoid 50 Ng, Ur ~~LOC~~: NOT DETECTED
Cocaine Metabolite,Ur ~~LOC~~: POSITIVE — AB
MDMA (ECSTASY) UR SCREEN: NOT DETECTED
Methadone Scn, Ur: POSITIVE — AB
Opiate, Ur Screen: POSITIVE — AB
Phencyclidine (PCP) Ur S: NOT DETECTED
Tricyclic, Ur Screen: POSITIVE — AB

## 2018-09-11 LAB — HIV ANTIBODY (ROUTINE TESTING W REFLEX): HIV Screen 4th Generation wRfx: NONREACTIVE

## 2018-09-11 MED ORDER — TRAZODONE HCL 100 MG PO TABS
400.0000 mg | ORAL_TABLET | Freq: Every day | ORAL | Status: DC
  Administered 2018-09-11 – 2018-09-14 (×3): 400 mg via ORAL
  Filled 2018-09-11 (×4): qty 4

## 2018-09-11 NOTE — Progress Notes (Signed)
Clinical Education officer, museum (CSW) met with patient alone at bedside and presented SNF bed offers. Patient has 1 bed offer from [facility]. Per patient he prefers to D/C home but is open to SNF if needed after surgery. Per chart patient will have surgery tomorrow. CSW will continue to follow and assist as needed.   [employer], LCSW [phone]

## 2018-09-11 NOTE — Progress Notes (Addendum)
   Subjective:    Patient states left hip pain is moderate.  Describes the pain as soreness on the lateral aspect of the hip.  Is scheduled for ORIF of left hip periprosthetic fracture tomorrow, 09/12/2018 Denies any CP, SOB, ABD pain.   Objective: Vital signs in last 24 hours: Temp:  [97.7 F (36.5 C)-98 F (36.7 C)] 97.7 F (36.5 C) (02/05 0757) Pulse Rate:  [89-99] 95 (02/05 0757) Resp:  [18-19] 19 (02/05 0757) BP: (129-143)/(83-95) 129/86 (02/05 0757) SpO2:  [89 %-95 %] 95 % (02/05 0757)  Intake/Output from previous day: 02/04 0701 - 02/05 0700 In: 3257.8 [P.O.:960; I.V.:2297.8] Out: 1700 [Urine:1700] Intake/Output this shift: Total I/O In: 665.1 [P.O.:240; I.V.:425.1] Out: -   Recent Labs    09/09/18 1923  HGB 13.2   Recent Labs    09/09/18 1923  WBC 13.0*  RBC 4.57  HCT 39.6  PLT 241   Recent Labs    09/09/18 1923  NA 131*  K 4.6  CL 97*  CO2 28  BUN 9  CREATININE 0.88  GLUCOSE 97  CALCIUM 8.1*   Recent Labs    09/09/18 1923  INR 1.03    EXAM General - Patient is Alert, Appropriate and Oriented Extremity - Neurovascular intact Sensation intact distally Intact pulses distally Dorsiflexion/Plantar flexion intact No cellulitis present Compartment soft Motor Function - intact, moving foot and toes well on exam.   Past Medical History:  Diagnosis Date  . Arthritis    hands, foot, arm  . COPD (chronic obstructive pulmonary disease) (HCC)   . Depression   . GERD (gastroesophageal reflux disease)   . Hypercholesteremia   . Hypertension     Assessment/Plan:   1 Day Post-Op Procedure(s) (LRB): OPEN REDUCTION INTERNAL FIXATION HIP (Left) Active Problems:   Periprosthetic fracture around internal prosthetic hip joint  -Plan on ORIF with plate and cerclage wires and screws tomorrow 09/12/2018 with Dr. Rosita Kea If drug screen negative for cocaine per anesthesia   Estimated body mass index is 40.39 kg/m as calculated from the following:  Height as of this encounter: 5\' 11"  (1.803 m).   Weight as of this encounter: 131.4 kg.   Lollie Marrow, PA-C [facility] 09/11/2018, 11:58 AM

## 2018-09-12 ENCOUNTER — Encounter
Admission: EM | Disposition: A | Discharge status: Skilled Nursing Facility | Payer: Self-pay | Source: Home / Self Care | Attending: Orthopedic Surgery

## 2018-09-12 ENCOUNTER — Inpatient Hospital Stay: Payer: Medicare Other

## 2018-09-12 ENCOUNTER — Inpatient Hospital Stay: Payer: Medicare Other | Admitting: Anesthesiology

## 2018-09-12 ENCOUNTER — Encounter: Payer: Self-pay | Admitting: Anesthesiology

## 2018-09-12 HISTORY — PX: ORIF PERIPROSTHETIC FRACTURE: SHX5034

## 2018-09-12 LAB — URINE DRUG SCREEN, QUALITATIVE (ARMC ONLY)
Amphetamines, Ur Screen: NOT DETECTED
Barbiturates, Ur Screen: NOT DETECTED
Benzodiazepine, Ur Scrn: NOT DETECTED
CANNABINOID 50 NG, UR ~~LOC~~: NOT DETECTED
COCAINE METABOLITE, UR ~~LOC~~: NOT DETECTED
MDMA (Ecstasy)Ur Screen: NOT DETECTED
Methadone Scn, Ur: POSITIVE — AB
Opiate, Ur Screen: POSITIVE — AB
Phencyclidine (PCP) Ur S: NOT DETECTED
Tricyclic, Ur Screen: POSITIVE — AB

## 2018-09-12 LAB — CBC
HEMATOCRIT: 38.3 % — AB (ref 39.0–52.0)
Hemoglobin: 12.5 g/dL — ABNORMAL LOW (ref 13.0–17.0)
MCH: 29.5 pg (ref 26.0–34.0)
MCHC: 32.6 g/dL (ref 30.0–36.0)
MCV: 90.3 fL (ref 80.0–100.0)
Platelets: 228 10*3/uL (ref 150–400)
RBC: 4.24 MIL/uL (ref 4.22–5.81)
RDW: 13.2 % (ref 11.5–15.5)
WBC: 17.2 10*3/uL — ABNORMAL HIGH (ref 4.0–10.5)
nRBC: 0 % (ref 0.0–0.2)

## 2018-09-12 LAB — CREATININE, SERUM
Creatinine, Ser: 0.88 mg/dL (ref 0.61–1.24)
GFR calc Af Amer: >60 mL/min (ref >60)
GFR calc non Af Amer: >60 mL/min (ref >60)

## 2018-09-12 SURGERY — OPEN REDUCTION INTERNAL FIXATION (ORIF) PERIPROSTHETIC FRACTURE
Anesthesia: General | Site: Hip | Laterality: Left

## 2018-09-12 MED ORDER — CEFAZOLIN SODIUM 1 G IJ SOLR
INTRAMUSCULAR | Status: AC
  Filled 2018-09-12: qty 10

## 2018-09-12 MED ORDER — GABAPENTIN 300 MG PO CAPS
900.0000 mg | ORAL_CAPSULE | Freq: Once | ORAL | Status: AC
  Administered 2018-09-12: 900 mg via ORAL

## 2018-09-12 MED ORDER — METOCLOPRAMIDE HCL 10 MG PO TABS: 5.0000 mg | ORAL_TABLET | Freq: Three times a day (TID) | ORAL | Status: DC | PRN

## 2018-09-12 MED ORDER — PHENYLEPHRINE HCL 10 MG/ML IJ SOLN
INTRAMUSCULAR | Status: AC
  Filled 2018-09-12: qty 1

## 2018-09-12 MED ORDER — FENTANYL CITRATE (PF) 100 MCG/2ML IJ SOLN
25.0000 ug | INTRAMUSCULAR | Status: DC | PRN
  Administered 2018-09-12 (×3): 50 ug via INTRAVENOUS

## 2018-09-12 MED ORDER — LIDOCAINE HCL (PF) 2 % IJ SOLN
INTRAMUSCULAR | Status: AC
  Filled 2018-09-12: qty 10

## 2018-09-12 MED ORDER — ACETAMINOPHEN 10 MG/ML IV SOLN
INTRAVENOUS | Status: DC | PRN
  Administered 2018-09-12: 1000 mg via INTRAVENOUS

## 2018-09-12 MED ORDER — FENTANYL CITRATE (PF) 100 MCG/2ML IJ SOLN
INTRAMUSCULAR | Status: DC | PRN
  Administered 2018-09-12 (×5): 50 ug via INTRAVENOUS

## 2018-09-12 MED ORDER — SODIUM CHLORIDE 0.9 % IV SOLN
INTRAVENOUS | Status: DC
  Administered 2018-09-12 – 2018-09-14 (×5): via INTRAVENOUS

## 2018-09-12 MED ORDER — PHENYLEPHRINE HCL 10 MG/ML IJ SOLN
INTRAMUSCULAR | Status: DC | PRN
  Administered 2018-09-12 (×3): 100 ug via INTRAVENOUS

## 2018-09-12 MED ORDER — PROPOFOL 10 MG/ML IV BOLUS
INTRAVENOUS | Status: DC | PRN
  Administered 2018-09-12: 150 mg via INTRAVENOUS

## 2018-09-12 MED ORDER — ENOXAPARIN SODIUM 40 MG/0.4ML ~~LOC~~ SOLN
40.0000 mg | Freq: Two times a day (BID) | SUBCUTANEOUS | Status: DC
  Administered 2018-09-13 – 2018-09-15 (×5): 40 mg via SUBCUTANEOUS
  Filled 2018-09-12 (×4): qty 0.4

## 2018-09-12 MED ORDER — HYDROMORPHONE HCL 1 MG/ML IJ SOLN
INTRAMUSCULAR | Status: DC | PRN
  Administered 2018-09-12 (×2): 0.5 mg via INTRAVENOUS

## 2018-09-12 MED ORDER — HYDROMORPHONE HCL 1 MG/ML IJ SOLN
INTRAMUSCULAR | Status: AC
  Administered 2018-09-12: 0.5 mg via INTRAVENOUS
  Filled 2018-09-12: qty 1

## 2018-09-12 MED ORDER — ONDANSETRON HCL 4 MG PO TABS: 4.0000 mg | ORAL_TABLET | Freq: Four times a day (QID) | ORAL | Status: DC | PRN

## 2018-09-12 MED ORDER — CEFAZOLIN SODIUM-DEXTROSE 2-4 GM/100ML-% IV SOLN
2.0000 g | Freq: Four times a day (QID) | INTRAVENOUS | Status: AC
  Administered 2018-09-12 – 2018-09-13 (×3): 2 g via INTRAVENOUS
  Filled 2018-09-12 (×4): qty 100

## 2018-09-12 MED ORDER — LIDOCAINE HCL (CARDIAC) PF 100 MG/5ML IV SOSY
PREFILLED_SYRINGE | INTRAVENOUS | Status: DC | PRN
  Administered 2018-09-12: 40 mg via INTRAVENOUS

## 2018-09-12 MED ORDER — BISACODYL 10 MG RE SUPP
10.0000 mg | Freq: Every day | RECTAL | Status: DC | PRN
  Administered 2018-09-15: 10 mg via RECTAL
  Filled 2018-09-12: qty 1

## 2018-09-12 MED ORDER — METOCLOPRAMIDE HCL 5 MG/ML IJ SOLN: 5.0000 mg | Freq: Three times a day (TID) | INTRAMUSCULAR | Status: DC | PRN

## 2018-09-12 MED ORDER — GABAPENTIN 300 MG PO CAPS
ORAL_CAPSULE | ORAL | Status: AC
  Filled 2018-09-12: qty 3

## 2018-09-12 MED ORDER — ONDANSETRON HCL 4 MG/2ML IJ SOLN: 4.0000 mg | Freq: Four times a day (QID) | INTRAMUSCULAR | Status: DC | PRN

## 2018-09-12 MED ORDER — MENTHOL 3 MG MT LOZG
1.0000 | LOZENGE | OROMUCOSAL | Status: DC | PRN
  Filled 2018-09-12: qty 9

## 2018-09-12 MED ORDER — ROCURONIUM BROMIDE 100 MG/10ML IV SOLN
INTRAVENOUS | Status: DC | PRN
  Administered 2018-09-12: 50 mg via INTRAVENOUS

## 2018-09-12 MED ORDER — SUVOREXANT 20 MG PO TABS
20.0000 mg | ORAL_TABLET | Freq: Every day | ORAL | Status: DC
  Administered 2018-09-13 – 2018-09-15 (×2): 20 mg via ORAL
  Filled 2018-09-12 (×2): qty 1

## 2018-09-12 MED ORDER — HYDROMORPHONE HCL 1 MG/ML IJ SOLN
0.5000 mg | INTRAMUSCULAR | Status: AC | PRN
  Administered 2018-09-12 (×4): 0.5 mg via INTRAVENOUS

## 2018-09-12 MED ORDER — ALUM & MAG HYDROXIDE-SIMETH 200-200-20 MG/5ML PO SUSP: 30.0000 mL | ORAL | Status: DC | PRN

## 2018-09-12 MED ORDER — FENTANYL CITRATE (PF) 100 MCG/2ML IJ SOLN
INTRAMUSCULAR | Status: AC
  Administered 2018-09-12: 50 ug via INTRAVENOUS
  Filled 2018-09-12: qty 2

## 2018-09-12 MED ORDER — PHENOL 1.4 % MT LIQD
1.0000 | OROMUCOSAL | Status: DC | PRN
  Filled 2018-09-12: qty 177

## 2018-09-12 MED ORDER — ACETAMINOPHEN 10 MG/ML IV SOLN
INTRAVENOUS | Status: AC
  Filled 2018-09-12: qty 100

## 2018-09-12 MED ORDER — FENTANYL CITRATE (PF) 250 MCG/5ML IJ SOLN
INTRAMUSCULAR | Status: AC
  Filled 2018-09-12: qty 5

## 2018-09-12 MED ORDER — MIDAZOLAM HCL 2 MG/2ML IJ SOLN
INTRAMUSCULAR | Status: AC
  Filled 2018-09-12: qty 2

## 2018-09-12 MED ORDER — MIDAZOLAM HCL 2 MG/2ML IJ SOLN
INTRAMUSCULAR | Status: DC | PRN
  Administered 2018-09-12: 2 mg via INTRAVENOUS

## 2018-09-12 MED ORDER — HYDROMORPHONE HCL 1 MG/ML IJ SOLN
INTRAMUSCULAR | Status: AC
  Filled 2018-09-12: qty 1

## 2018-09-12 MED ORDER — DEXMEDETOMIDINE HCL 200 MCG/2ML IV SOLN
INTRAVENOUS | Status: DC | PRN
  Administered 2018-09-12: 4 ug via INTRAVENOUS
  Administered 2018-09-12: 8 ug via INTRAVENOUS
  Administered 2018-09-12 (×3): 4 ug via INTRAVENOUS
  Administered 2018-09-12: 12 ug via INTRAVENOUS

## 2018-09-12 MED ORDER — MAGNESIUM HYDROXIDE 400 MG/5ML PO SUSP: 30.0000 mL | Freq: Every day | ORAL | Status: DC | PRN

## 2018-09-12 MED ORDER — OXYCODONE HCL 5 MG/5ML PO SOLN: 5.0000 mg | Freq: Once | ORAL | Status: DC | PRN

## 2018-09-12 MED ORDER — LACTATED RINGERS IV SOLN
INTRAVENOUS | Status: DC | PRN
  Administered 2018-09-12: 17:00:00 via INTRAVENOUS

## 2018-09-12 MED ORDER — OXYCODONE HCL 5 MG PO TABS: 5.0000 mg | ORAL_TABLET | Freq: Once | ORAL | Status: DC | PRN

## 2018-09-12 MED ORDER — ROCURONIUM BROMIDE 50 MG/5ML IV SOLN
INTRAVENOUS | Status: AC
  Filled 2018-09-12: qty 1

## 2018-09-12 MED ORDER — DOCUSATE SODIUM 100 MG PO CAPS: 100.0000 mg | ORAL_CAPSULE | Freq: Two times a day (BID) | ORAL | Status: DC

## 2018-09-12 MED ORDER — SODIUM CHLORIDE 0.9 % IV SOLN
INTRAVENOUS | Status: DC | PRN
  Administered 2018-09-12: 20 ug/min via INTRAVENOUS

## 2018-09-12 MED ORDER — SUGAMMADEX SODIUM 500 MG/5ML IV SOLN
INTRAVENOUS | Status: DC | PRN
  Administered 2018-09-12: 260 mg via INTRAVENOUS

## 2018-09-12 MED ORDER — PROPOFOL 10 MG/ML IV BOLUS
INTRAVENOUS | Status: AC
  Filled 2018-09-12: qty 20

## 2018-09-12 SURGICAL SUPPLY — 38 items
CABLE ASSY CERCLAGE SST 1.8X55 (Orthopedic Implant) ×12 IMPLANT
CANISTER SUCT 1200ML W/VALVE (MISCELLANEOUS) ×3 IMPLANT
CHLORAPREP W/TINT 26ML (MISCELLANEOUS) ×3 IMPLANT
COVER WAND RF STERILE (DRAPES) ×3 IMPLANT
DRAPE C-ARM XRAY 36X54 (DRAPES) ×3 IMPLANT
DRAPE C-ARMOR (DRAPES) ×3 IMPLANT
ELECT REM PT RETURN 9FT ADLT (ELECTROSURGICAL) ×3
ELECTRODE REM PT RTRN 9FT ADLT (ELECTROSURGICAL) ×1 IMPLANT
GAUZE PETRO XEROFOAM 1X8 (MISCELLANEOUS) ×3 IMPLANT
GAUZE SPONGE 4X4 12PLY STRL (GAUZE/BANDAGES/DRESSINGS) ×3 IMPLANT
GLOVE BIOGEL PI IND STRL 9 (GLOVE) ×1 IMPLANT
GLOVE BIOGEL PI INDICATOR 9 (GLOVE) ×2
GLOVE SURG SYN 9.0  PF PI (GLOVE) ×2
GLOVE SURG SYN 9.0 PF PI (GLOVE) ×1 IMPLANT
GOWN SRG 2XL LVL 4 RGLN SLV (GOWNS) ×1 IMPLANT
GOWN STRL NON-REIN 2XL LVL4 (GOWNS) ×2
GOWN STRL REUS W/ TWL LRG LVL3 (GOWN DISPOSABLE) ×1 IMPLANT
GOWN STRL REUS W/TWL LRG LVL3 (GOWN DISPOSABLE) ×2
HEMOVAC 400ML (MISCELLANEOUS) ×3
KIT DRAIN HEMOVAC JP 7FR 400ML (MISCELLANEOUS) ×1 IMPLANT
KIT TURNOVER KIT A (KITS) ×3 IMPLANT
MAT ABSORB  FLUID 56X50 GRAY (MISCELLANEOUS) ×2
MAT ABSORB FLUID 56X50 GRAY (MISCELLANEOUS) ×1 IMPLANT
NEEDLE FILTER BLUNT 18X 1/2SAF (NEEDLE) ×2
NEEDLE FILTER BLUNT 18X1 1/2 (NEEDLE) ×1 IMPLANT
NS IRRIG 500ML POUR BTL (IV SOLUTION) ×3 IMPLANT
PACK HIP PROSTHESIS (MISCELLANEOUS) ×3 IMPLANT
PIN POSITIONING THREADED (PIN) ×12 IMPLANT
PLATE CRV LCP 14 HOLE 4.5X265 (Plate) ×3 IMPLANT
SCALPEL PROTECTED #10 DISP (BLADE) ×6 IMPLANT
SCREW CORTEX ST 4.5X42 (Screw) ×3 IMPLANT
SCREW CORTEX ST 4.5X46 (Screw) ×3 IMPLANT
STAPLER SKIN PROX 35W (STAPLE) ×3 IMPLANT
SUT VIC AB 0 CT1 36 (SUTURE) ×6 IMPLANT
SUT VIC AB 2-0 CT1 27 (SUTURE) ×4
SUT VIC AB 2-0 CT1 TAPERPNT 27 (SUTURE) ×2 IMPLANT
SYR 5ML LL (SYRINGE) ×3 IMPLANT
TAPE MICROFOAM 4IN (TAPE) ×3 IMPLANT

## 2018-09-12 NOTE — Anesthesia Preprocedure Evaluation (Addendum)
Anesthesia Evaluation  Patient identified by MRN, date of birth, ID band Patient awake    Reviewed: Allergy & Precautions, H&P , NPO status , Patient's Chart, lab work & pertinent test results  History of Anesthesia Complications Negative for: history of anesthetic complications  Airway Mallampati: II  TM Distance: >3 FB Neck ROM: full    Dental  (+) Edentulous Upper, Edentulous Lower   Pulmonary neg shortness of breath, sleep apnea , COPD, Current Smoker,           Cardiovascular Exercise Tolerance: Good hypertension, (-) angina(-) Past MI and (-) DOE      Neuro/Psych PSYCHIATRIC DISORDERS negative neurological ROS     GI/Hepatic Neg liver ROS, GERD  Medicated and Controlled,  Endo/Other  negative endocrine ROS  Renal/GU      Musculoskeletal   Abdominal   Peds  Hematology negative hematology ROS (+)   Anesthesia Other Findings Past Medical History: No date: Arthritis     Comment:  hands, foot, arm No date: COPD (chronic obstructive pulmonary disease) (HCC) No date: Depression No date: GERD (gastroesophageal reflux disease) No date: Hypercholesteremia No date: Hypertension   Reproductive/Obstetrics negative OB ROS                            Anesthesia Physical Anesthesia Plan  ASA: III  Anesthesia Plan: General ETT   Post-op Pain Management:    Induction: Intravenous  PONV Risk Score and Plan: Ondansetron, Dexamethasone, Midazolam and Treatment may vary due to age or medical condition  Airway Management Planned: Oral ETT  Additional Equipment:   Intra-op Plan:   Post-operative Plan: Extubation in OR  Informed Consent: I have reviewed the patients History and Physical, chart, labs and discussed the procedure including the risks, benefits and alternatives for the proposed anesthesia with the patient or authorized representative who has indicated his/her understanding and  acceptance.     Dental Advisory Given  Plan Discussed with: Anesthesiologist, CRNA and Surgeon  Anesthesia Plan Comments: (Patient declines spinal and request GA  Patient consented for risks of anesthesia including but not limited to:  - adverse reactions to medications - damage to teeth, lips or other oral mucosa - sore throat or hoarseness - Damage to heart, brain, lungs or loss of life  Patient voiced understanding.)       Anesthesia Quick Evaluation

## 2018-09-12 NOTE — Op Note (Signed)
09/12/2018  7:02 PM  PATIENT:  Richard Davis  43 y.o. male  PRE-OPERATIVE DIAGNOSIS:  LEFT FEMUR FRACTURE periprosthetic proximal femur around total hip  POST-OPERATIVE DIAGNOSIS:  LEFT FEMUR FRACTURE periprosthetic proximal femur around total hip  PROCEDURE:  Procedure(s): OPEN REDUCTION INTERNAL FIXATION (ORIF) PERIPROSTHETIC FRACTURE (Left)  SURGEON: Leitha Schuller, MD  ASSISTANTS: None  ANESTHESIA:   general  EBL:  No intake/output data recorded.  BLOOD ADMINISTERED:none  DRAINS: none   LOCAL MEDICATIONS USED:  NONE  SPECIMEN:  No Specimen  DISPOSITION OF SPECIMEN:  N/A  COUNTS:  YES  TOURNIQUET:   None  IMPLANTS: Synthes periprosthetic LCDC plate with cerclage positioning pins x4 cable ready cable grip system from [employer] x4 and 2 cortical screws  DICTATION: .Dragon Dictation patient was brought to the operating room and after adequate general anesthesia was obtained the patient was transferred to the operative table and with the pegboard placed in a lateral decubitus position.  The left hip was then prepped and draped in the usual sterile fashion.  Patient identification and timeout procedures were completed.  A lateral approach was made to the shaft of the femur after you bring in C arm and making sure we had good visualization of the fracture.  The IT band was split and the vastus lateralis was elevated off the intermuscular septum and then the lateral aspect of the femur from distal to proximal until adequate exposure for plating was obtained.  The curved plate was applied and then 3 proximal cerclage wires were placed with the cerclage pin holder in a locking screw hole these were placed proximal to the fracture there is additional distal cerclage wire and then 2 additional cortical screws through the plate under fluoroscopic evaluation this appeared to give anatomic alignment with good reduction.  The wound was thoroughly irrigated with pulsatile lavage and the IT band  repaired using a heavy Quill suture, 30V lock subcutaneous and skin staples followed by incisional wound VAC  PLAN OF CARE: Continue as inpatient  PATIENT DISPOSITION:  PACU - hemodynamically stable.

## 2018-09-12 NOTE — Anesthesia Post-op Follow-up Note (Signed)
Anesthesia QCDR form completed.        

## 2018-09-12 NOTE — Transfer of Care (Signed)
Immediate Anesthesia Transfer of Care Note  Patient: Richard Davis  Procedure(s) Performed: OPEN REDUCTION INTERNAL FIXATION (ORIF) PERIPROSTHETIC FRACTURE (Left Hip)  Patient Location: PACU  Anesthesia Type:General  Level of Consciousness: awake  Airway & Oxygen Therapy: Patient connected to face mask oxygen  Post-op Assessment: Post -op Vital signs reviewed and stable  Post vital signs: stable  Last Vitals:  Vitals Value Taken Time  BP 134/94 09/12/2018  7:07 PM  Temp 36.4 C 09/12/2018  7:07 PM  Pulse 116 09/12/2018  7:20 PM  Resp 16 09/12/2018  7:20 PM  SpO2 92 % 09/12/2018  7:20 PM  Vitals shown include unvalidated device data.  Last Pain:  Vitals:   09/12/18 1457  TempSrc:   PainSc: 5       Patients Stated Pain Goal: 4 (09/12/18 1333)  Complications: No apparent anesthesia complications
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
## 2018-09-12 NOTE — Progress Notes (Signed)
Anticoagulation monitoring(Lovenox):   43 yo male ordered Lovenox 40 mg Q24h  Filed Weights   09/09/18 1843 09/10/18 0942  Weight: 225 lb (102.1 kg) 289 lb 9.5 oz (131.4 kg)   BMI 40.39   Lab Results  Component Value Date   CREATININE 0.88 09/09/2018   CREATININE 0.87 04/14/2017   CREATININE 1.10 09/11/2016   Estimated Creatinine Clearance: 148 mL/min (by C-G formula based on SCr of 0.88 mg/dL). Hemoglobin & Hematocrit     Component Value Date/Time   HGB 13.2 09/09/2018 1923   HGB 14.0 04/20/2014 1424   HCT 39.6 09/09/2018 1923   HCT 41.5 04/20/2014 1424     Per Protocol for Patient with estCrcl > 30 ml/min and BMI > 40, will transition to Lovenox 40 mg Q12h.

## 2018-09-12 NOTE — Progress Notes (Signed)
Drug screen negative for cocaine, plan ORIF femur todayy

## 2018-09-12 NOTE — Care Management Important Message (Signed)
Important Message  Patient Details  Name: Richard Davis MRN: [id number] Date of Birth: 1975/11/03   Medicare Important Message Given:  Yes    Olegario Messier A Sadarius Norman 09/12/2018, 11:20 AM

## 2018-09-12 NOTE — Anesthesia Procedure Notes (Signed)
Procedure Name: Intubation Date/Time: 09/12/2018 5:08 PM Performed by: Aline Brochure, CRNA Pre-anesthesia Checklist: Patient identified, Emergency Drugs available, Suction available and Patient being monitored Patient Re-evaluated:Patient Re-evaluated prior to induction Oxygen Delivery Method: Circle system utilized Preoxygenation: Pre-oxygenation with 100% oxygen Induction Type: IV induction Ventilation: Mask ventilation without difficulty Laryngoscope Size: Mac and 4 Grade View: Grade I Tube type: Oral Number of attempts: 1 Airway Equipment and Method: Stylet Placement Confirmation: ETT inserted through vocal cords under direct vision,  positive ETCO2 and breath sounds checked- equal and bilateral Tube secured with: Tape Dental Injury: Teeth and Oropharynx as per pre-operative assessment

## 2018-09-13 ENCOUNTER — Encounter: Payer: Self-pay | Admitting: Orthopedic Surgery

## 2018-09-13 LAB — CBC
HCT: 37.9 % — ABNORMAL LOW (ref 39.0–52.0)
Hemoglobin: 12.2 g/dL — ABNORMAL LOW (ref 13.0–17.0)
MCH: 29.2 pg (ref 26.0–34.0)
MCHC: 32.2 g/dL (ref 30.0–36.0)
MCV: 90.7 fL (ref 80.0–100.0)
Platelets: 247 10*3/uL (ref 150–400)
RBC: 4.18 MIL/uL — ABNORMAL LOW (ref 4.22–5.81)
RDW: 13.2 % (ref 11.5–15.5)
WBC: 11.9 10*3/uL — ABNORMAL HIGH (ref 4.0–10.5)
nRBC: 0 % (ref 0.0–0.2)

## 2018-09-13 LAB — BASIC METABOLIC PANEL
Anion gap: 6 (ref 5–15)
BUN: 7 mg/dL (ref 6–20)
CO2: 31 mmol/L (ref 22–32)
Calcium: 8.5 mg/dL — ABNORMAL LOW (ref 8.9–10.3)
Chloride: 100 mmol/L (ref 98–111)
Creatinine, Ser: 0.83 mg/dL (ref 0.61–1.24)
GFR calc Af Amer: >60 mL/min (ref >60)
GFR calc non Af Amer: >60 mL/min (ref >60)
Glucose, Bld: 109 mg/dL — ABNORMAL HIGH (ref 70–99)
Potassium: 3.9 mmol/L (ref 3.5–5.1)
SODIUM: 137 mmol/L (ref 135–145)

## 2018-09-13 NOTE — Progress Notes (Signed)
Pt has been tachycardic today with HR up to 116. BPs stable, has been afebrile. Pain rating has been unchanged compared to prior to surgery. Dr. Rosita Kea notified, instructed to restart IVF at 75 cc an hour.

## 2018-09-13 NOTE — Clinical Social Work Placement (Signed)
   CLINICAL SOCIAL WORK PLACEMENT  NOTE  Date:  09/13/2018  Patient Details  Name: Lorelee CoverMichael B Michele MRN: [id number] Date of Birth: August 09, 1975  Clinical Social Work is seeking post-discharge placement for this patient at the Skilled  Nursing Facility level of care (*CSW will initial, date and re-position this form in  chart as items are completed):  Yes   Patient/family provided with [place] Clinical Social Work Department's list of facilities offering this level of care within the geographic area requested by the patient (or if unable, by the patient's family).  Yes   Patient/family informed of their freedom to choose among providers that offer the needed level of care, that participate in Medicare, Medicaid or managed care program needed by the patient, have an available bed and are willing to accept the patient.  Yes   Patient/family informed of [facility]'s ownership interest [facility] and [facility], as well as of the fact that they are under no obligation to receive care at these facilities.  PASRR submitted to EDS on 09/10/18     PASRR number received on 09/10/18     Existing PASRR number confirmed on       FL2 transmitted to all facilities in geographic area requested by pt/family on 09/10/18     FL2 transmitted to all facilities within larger geographic area on       Patient informed that his/her managed care company has contracts with or will negotiate with certain facilities, including the following:        Yes   Patient/family informed of bed offers received.  Patient [facility]     Physician recommends and patient chooses bed at      Patient to be transferred to   on  .  Patient to be transferred to facility by       Patient family notified on   of transfer.  Name of family member notified:        PHYSICIAN       Additional Comment:    _______________________________________________ Matty Deamer, Darleen CrockerBailey M,  LCSW 09/13/2018, 2:32 PM

## 2018-09-13 NOTE — Progress Notes (Signed)
PT is recommending SNF. Clinical Social Worker (CSW) met with patient and his mother Vaughan Basta was at bedside. CSW discussed SNF option and made them aware that [facility] is the only bed offer patient has. Patient accepted bed offer from [facility] but is hoping to improve enough with PT to go home. [facility] admissions coordinator at [employer] is aware of above.   [employer], LCSW [phone]

## 2018-09-13 NOTE — Care Management Note (Addendum)
Case Management Note  Patient Details  Name: Richard Davis MRN: [id number] Date of Birth: 12-Jun-1976  Subjective/Objective:                  RNCM met with a patient and his mother. He agrees to rehab at home or at a facility.  PT is recommending SNF currently.  He is from home alone. He has a front-wheeled walker per patient and his mother. He may need a bedside commode.  He uses [facility] [phone]  for medications. He would like to use [facility] if he can go home at discharge.   Action/Plan: Home health list provided per [email or web address]. Lovenox 60m injection daily for 14 days no refills called in to [facility] RNCM to cancel if patient does go to SNF.  However if patient is able to return to home RNCM to order Bedside commode.  [facility]. RNCM will follow. Updated: Lovenox $3.  Expected Discharge Date:                  Expected Discharge Plan:     In-House Referral:     Discharge planning Services  CM Consult  Post Acute Care Choice:  Home Health, Durable Medical Equipment Choice offered to:  Patient, Parent  DME Arranged:  3-N-1 DME Agency:  [facility]  Status of Service:  In process, will continue to follow  If discussed at Long Length of Stay Meetings, dates discussed:    Additional Comments:  AMarshell Garfinkel RN 09/13/2018, 12:53 PM
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
## 2018-09-13 NOTE — Progress Notes (Signed)
Subjective: 1 Day Post-Op Procedure(s) (LRB): OPEN REDUCTION INTERNAL FIXATION (ORIF) PERIPROSTHETIC FRACTURE (Left) Patient reports pain as moderate.   Patient is well, and has had no acute complaints or problems PT and Care management to assist with discharge planning. Negative for chest pain and shortness of breath Fever: no Gastrointestinal:Negative for nausea and vomiting  Objective: Vital signs in last 24 hours: Temp:  [97.5 F (36.4 C)-98.7 F (37.1 C)] 98.7 F (37.1 C) (02/07 0351) Pulse Rate:  [89-125] 101 (02/07 0351) Resp:  [10-22] 19 (02/07 0351) BP: (131-172)/(84-102) 150/97 (02/07 0351) SpO2:  [92 %-99 %] 98 % (02/07 0351)  Intake/Output from previous day:  Intake/Output Summary (Last 24 hours) at 09/13/2018 0747 Last data filed at 09/13/2018 0600 Gross per 24 hour  Intake 3185.27 ml  Output 4150 ml  Net -964.73 ml    Intake/Output this shift: No intake/output data recorded.  Labs: Recent Labs    09/12/18 2119 09/13/18 0339  HGB 12.5* 12.2*   Recent Labs    09/12/18 2119 09/13/18 0339  WBC 17.2* 11.9*  RBC 4.24 4.18*  HCT 38.3* 37.9*  PLT 228 247   Recent Labs    09/12/18 2119 09/13/18 0339  NA  --  137  K  --  3.9  CL  --  100  CO2  --  31  BUN  --  7  CREATININE 0.88 0.83  GLUCOSE  --  109*  CALCIUM  --  8.5*   No results for input(s): LABPT, INR in the last 72 hours.  EXAM General - Patient is Alert, Appropriate and Oriented Extremity - ABD soft Sensation intact distally Intact pulses distally Dorsiflexion/Plantar flexion intact Incision: dressing C/D/I No cellulitis present Dressing/Incision - Woundvac intact to the left hip, mild bloody drainage. Motor Function - intact, moving foot and toes well on exam.  Abdomen soft with normal BS this AM.  Past Medical History:  Diagnosis Date  . Arthritis    hands, foot, arm  . COPD (chronic obstructive pulmonary disease) (HCC)   . Depression   . GERD (gastroesophageal reflux  disease)   . Hypercholesteremia   . Hypertension     Assessment/Plan: 1 Day Post-Op Procedure(s) (LRB): OPEN REDUCTION INTERNAL FIXATION (ORIF) PERIPROSTHETIC FRACTURE (Left) Active Problems:   Periprosthetic fracture around internal prosthetic hip joint  Estimated body mass index is 40.39 kg/m as calculated from the following:   Height as of this encounter: 5\' 11"  (1.803 m).   Weight as of this encounter: 131.4 kg. Advance diet Up with therapy D/C IV fluids when tolerating po intake this AM.  Labs reviewed this AM, Hg 12.2 this AM. CBC and BMP ordered for tomorrow morning. Up with therapy today. Begin working on BM.  DVT Prophylaxis - Lovenox, Foot Pumps and TED hose 80% weightbearing to the left leg with therapy.  Valeria BatmanJ. Lance McGhee, PA-C [facility] 09/13/2018, 7:47 AM
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
## 2018-09-13 NOTE — Progress Notes (Signed)
   09/13/18 1000  Clinical Encounter Type  Visited With Patient and family together  Visit Type Follow-up  Referral From Nurse  Stress Factors  Patient Stress Factors Health changes   Chaplain followed up with the patient post surgery. Upon arrival to the room, patient was awake, alert, and sitting up in the chair with his mother at the bed/chair-side. He reports that he is in a lot of pain after last evening's surgery, which he understands "went well." Patient also reports that he had "a rough night", but was able to sleep a couple hours immediately after returning to his room. No needs at this time. Patient and his mother expressed gratitude for the follow-up.

## 2018-09-13 NOTE — Progress Notes (Signed)
Physical Therapy Treatment Patient Details Name: Richard Davis MRN: [id number] DOB: [birth year] Today's Date: 09/13/2018    History of Present Illness Patient is 43 yo male s/p ORIF of L hip due to periprosthetic fracture with PMH of COPD, HTN, GERD, depression. Order for PWB, 75-80% on LLE.    PT Comments    Patient agreeable to PT, up in recliner (has been since AM session), pain in L hip 8/10 premedicated prior to initiating session. Patient needed modAx2 to transfer to bed with RW and shuffling steps, modAx2 to maintain balance and for controlled descent to chair, pt complaining of significant L hip pain. Able to participate in bed level therapeutic exercises with mild improvement in muscle activation compared to AM session. The patient would benefit from further skilled PT to continue to address limitations in mobility and changes from PLOF.    Follow Up Recommendations  SNF     Equipment Recommendations  None recommended by PT    Recommendations for Other Services       Precautions / Restrictions Precautions Precautions: Fall Restrictions Weight Bearing Restrictions: Yes LLE Weight Bearing: Partial weight bearing LLE Partial Weight Bearing Percentage or Pounds: 75%    Mobility  Bed Mobility Overal bed mobility: Needs Assistance Bed Mobility: Sit to Supine       Sit to supine: Mod assist;+2 for physical assistance   General bed mobility comments: verbal cues for technique  Transfers Overall transfer level: Needs assistance Equipment used: Rolling walker (2 wheeled) Transfers: Sit to/from Stand Sit to Stand: Mod assist;+2 physical assistance         General transfer comment: Patient with significant difficulty for hip extension. Cues for hand placement on RW.modAx2 to maintain balance  Ambulation/Gait Ambulation/Gait assistance: Mod assist;+2 physical assistance Gait Distance (Feet): 1 Feet Assistive device: Rolling walker (2 wheeled)       General  Gait Details: Increased difficulty this PM with weight bearing, upright posture, and stepping. modAx2 to maintain balance, and controlled descent to bed.   Stairs             Wheelchair Mobility    Modified Rankin (Stroke Patients Only)       Balance Overall balance assessment: Needs assistance Sitting-balance support: Feet supported Sitting balance-Leahy Scale: Fair       Standing balance-Leahy Scale: Poor                              Cognition Arousal/Alertness: Awake/alert Behavior During Therapy: WFL for tasks assessed/performed Overall Cognitive Status: Within Functional Limits for tasks assessed                                        Exercises Total Joint Exercises Ankle Circles/Pumps: AROM;10 reps;Both Quad Sets: AROM;10 reps;Left Gluteal Sets: AROM;Both;10 reps Short Arc Quad: AROM;Left;20 reps Heel Slides: AAROM;Left;10 reps Straight Leg Raises: AAROM;Left;10 reps     General Comments        Pertinent Vitals/Pain Pain Assessment: 0-10 Pain Score: 8  Pain Location: L hip at rest Pain Descriptors / Indicators: Aching;Sharp;Grimacing;Guarding Pain Intervention(s): Limited activity within patient's tolerance;Monitored during session;Repositioned;Premedicated before session    Home Living Family/patient expects to be discharged to:: Private residence Living Arrangements: Alone Available Help at Discharge: Family Type of Home: House Home Access: Stairs to enter Entrance Stairs-Rails: Right;Left;Can reach both Home Layout: One level Home  Equipment: Cane - single point;Bedside commode;Adaptive equipment Additional Comments: Pt may have a walker at home, unaware if it is a RW, will have mother check. Mother will be staying with pt to assist as needed. Has BSC but pt states it's too small    Prior Function Level of Independence: Independent          PT Goals (current goals can now be found in the care plan section)  Acute Rehab PT Goals Patient Stated Goal: to go home Progress towards PT goals: Not progressing toward goals - comment(limited by L hip pain)    Frequency    BID      PT Plan Current plan remains appropriate    Co-evaluation              AM-PAC PT "6 Clicks" Mobility   Outcome Measure  Help needed turning from your back to your side while in a flat bed without using bedrails?: A Lot Help needed moving from lying on your back to sitting on the side of a flat bed without using bedrails?: A Lot Help needed moving to and from a bed to a chair (including a wheelchair)?: A Lot Help needed standing up from a chair using your arms (e.g., wheelchair or bedside chair)?: A Lot Help needed to walk in hospital room?: Total Help needed climbing 3-5 steps with a railing? : Total 6 Click Score: 10    End of Session Equipment Utilized During Treatment: Gait belt Activity Tolerance: Patient limited by fatigue;Patient limited by pain Patient left: in bed;with bed alarm set;with SCD's reapplied;with call bell/phone within reach;with family/visitor present Nurse Communication: Mobility status PT Visit Diagnosis: Unsteadiness on feet (R26.81);Difficulty in walking, not elsewhere classified (R26.2);Pain;Other abnormalities of gait and mobility (R26.89) Pain - Right/Left: Left Pain - part of body: Hip     Time: 8250-5397 PT Time Calculation (min) (ACUTE ONLY): 25 min  Charges:  $Therapeutic Exercise: 8-22 mins $Therapeutic Activity: 8-22 mins                     Olga Coaster PT, DPT 2:50 PM,09/13/18 [phone]

## 2018-09-14 LAB — CBC
HCT: 33.5 % — ABNORMAL LOW (ref 39.0–52.0)
Hemoglobin: 11.2 g/dL — ABNORMAL LOW (ref 13.0–17.0)
MCH: 29.5 pg (ref 26.0–34.0)
MCHC: 33.4 g/dL (ref 30.0–36.0)
MCV: 88.2 fL (ref 80.0–100.0)
Platelets: 235 10*3/uL (ref 150–400)
RBC: 3.8 MIL/uL — ABNORMAL LOW (ref 4.22–5.81)
RDW: 13.4 % (ref 11.5–15.5)
WBC: 11.9 10*3/uL — ABNORMAL HIGH (ref 4.0–10.5)
nRBC: 0 % (ref 0.0–0.2)

## 2018-09-14 LAB — BASIC METABOLIC PANEL
Anion gap: 6 (ref 5–15)
BUN: 7 mg/dL (ref 6–20)
CALCIUM: 8.2 mg/dL — AB (ref 8.9–10.3)
CO2: 28 mmol/L (ref 22–32)
CREATININE: 0.74 mg/dL (ref 0.61–1.24)
Chloride: 101 mmol/L (ref 98–111)
GFR calc non Af Amer: >60 mL/min (ref >60)
Glucose, Bld: 116 mg/dL — ABNORMAL HIGH (ref 70–99)
Potassium: 3.8 mmol/L (ref 3.5–5.1)
Sodium: 135 mmol/L (ref 135–145)

## 2018-09-14 MED ORDER — SODIUM CHLORIDE 0.9 % IV BOLUS
1000.0000 mL | Freq: Once | INTRAVENOUS | Status: AC
  Administered 2018-09-14: 1000 mL via INTRAVENOUS

## 2018-09-14 NOTE — Significant Event (Signed)
Rapid Response Event Note  Overview: Time Called: 1454 Arrival Time: 1456 Event Type: Hypotension  Initial Focused Assessment: called for rapid response for hypotensive episode after pt getting out of and back to bed. Pt pod 2 ORIF hip. PT alert and oriented x4, lethargic.    Interventions: Dr Rosita Kea notified, order for NS bolus  Plan of Care (if not transferred): Ana RN to call if further assistance is needed.  Event Summary: Name of Physician Notified: menz at 1500    at    Outcome: Stayed in room and stabalized     Shabreka Coulon A

## 2018-09-14 NOTE — Progress Notes (Signed)
This RT responded to rapid response call to this patients room at 1456. Approximate time spent- 8 minutes.

## 2018-09-14 NOTE — Progress Notes (Signed)
Physical Therapy Treatment Patient Details Name: Richard CoverMichael B Davis MRN: [id number] DOB: 1975-11-18 Today's Date: 09/14/2018    History of Present Illness Patient is 43 yo male s/p ORIF of L hip due to periprosthetic fracture with PMH of COPD, HTN, GERD, depression. Order for PWB, 75-80% on LLE.    PT Comments    Pt reports increased pain today which he attributes to activity yesterday.  He does want to get out of bed and requires min/mod a x 2 to complete transition to edge of bed.  Once sitting, sits unsupported.  He was able to stand with min a x 2 and generally awkward movement to standing.  Once up he relies heavily on walker for support and is able to take several small steps to recliner at bedside before requiring min/mod support to descend into chair.  He declines further intervention at this time due to pain.  HR at rest 108, after transfer 124.  "I always run high" in regards to HR.   Follow Up Recommendations  SNF     Equipment Recommendations  None recommended by PT    Recommendations for Other Services       Precautions / Restrictions Precautions Precautions: Fall Restrictions Weight Bearing Restrictions: Yes LLE Weight Bearing: Partial weight bearing LLE Partial Weight Bearing Percentage or Pounds: 75-80%    Mobility  Bed Mobility Overal bed mobility: Needs Assistance Bed Mobility: Supine to Sit     Supine to sit: Min assist;+2 for physical assistance        Transfers Overall transfer level: Needs assistance Equipment used: Rolling walker (2 wheeled) Transfers: Sit to/from Stand Sit to Stand: Min assist;+2 physical assistance;+2 safety/equipment;Mod assist         General transfer comment: dependance on staff especially for stand to sit descent.  generally cautions with movement.  Ambulation/Gait Ambulation/Gait assistance: Min assist Gait Distance (Feet): 2 Feet Assistive device: Rolling walker (2 wheeled) Gait Pattern/deviations: Step-to  pattern;Decreased stance time - left Gait velocity: decreased   General Gait Details: Very slow gait with heavy reliance on walker for mobility.  Requires hands on assist at all times.  Limited by pain.   Stairs             Wheelchair Mobility    Modified Rankin (Stroke Patients Only)       Balance Overall balance assessment: Needs assistance Sitting-balance support: Feet supported Sitting balance-Leahy Scale: Fair     Standing balance support: Bilateral upper extremity supported Standing balance-Leahy Scale: Poor Standing balance comment: +1 assist at all times with supervision +1 for safety.                            Cognition Arousal/Alertness: Awake/alert Behavior During Therapy: WFL for tasks assessed/performed Overall Cognitive Status: Within Functional Limits for tasks assessed                                        Exercises      General Comments        Pertinent Vitals/Pain Pain Assessment: 0-10 Pain Score: 8  Pain Location: L hip at rest Pain Descriptors / Indicators: Aching;Sharp;Grimacing;Guarding Pain Intervention(s): Limited activity within patient's tolerance;Monitored during session;Premedicated before session    Home Living                      Prior  Function            PT Goals (current goals can now be found in the care plan section) Progress towards PT goals: Progressing toward goals    Frequency    BID      PT Plan Current plan remains appropriate    Co-evaluation              AM-PAC PT "6 Clicks" Mobility   Outcome Measure  Help needed turning from your back to your side while in a flat bed without using bedrails?: A Lot Help needed moving from lying on your back to sitting on the side of a flat bed without using bedrails?: A Lot Help needed moving to and from a bed to a chair (including a wheelchair)?: A Lot Help needed standing up from a chair using your arms (e.g.,  wheelchair or bedside chair)?: A Lot Help needed to walk in hospital room?: Total Help needed climbing 3-5 steps with a railing? : Total 6 Click Score: 10    End of Session Equipment Utilized During Treatment: Gait belt Activity Tolerance: Patient limited by pain Patient left: in chair;with call bell/phone within reach;with chair alarm set;with nursing/sitter in room   Pain - Right/Left: Left Pain - part of body: Hip     Time: 1012-1022 PT Time Calculation (min) (ACUTE ONLY): 10 min  Charges:  $Gait Training: 8-22 mins                     Danielle Dess, PTA 09/14/18, 10:35 AM

## 2018-09-14 NOTE — Progress Notes (Signed)
   09/14/18 1500  Clinical Encounter Type  Visited With Patient and family together  Visit Type Follow-up;Code  Referral From Nurse  Stress Factors  Patient Stress Factors Health changes  Ch responded to a RRT. Pt's mother was at bedside. Pt was lethargic. Ch stayed to offer a caring presence and kept the mother company. Ch let the mother know that ch is available if needed.

## 2018-09-14 NOTE — Progress Notes (Signed)
Physical Therapy Treatment Patient Details Name: AZAN FAVER MRN: [id number] DOB: [birth year] Today's Date: 09/14/2018    History of Present Illness Patient is 43 yo male s/p ORIF of L hip due to periprosthetic fracture with PMH of COPD, HTN, GERD, depression. Order for PWB, 75-80% on LLE.    PT Comments    Co-treatment performed with OT.  Pt asleep when PT and OT entered room and stated that he did not feel he could perform a transfer at this time due to R hip pain of 9/10.  Pt able to demonstrate understanding of seated there ex and perform sets of 10 bilaterally and agreed to attempt a transfer after "warming up".  He required mod A +2 to stand from chair, slowly and with heavy use of RW.  Pt perform standing pivot transfer, maintaining WB status and with VC's for use of RW.  Pt requried mod A +2 to return to bed and was able to perform unilateral bridge to position in bed.  Pt very hesitant with movement and focused on pain this session.  He will continue to benefit from skilled PT with focus on strength, tolerance to activity, safe functional mobility and pain management.  Follow Up Recommendations  SNF     Equipment Recommendations  None recommended by PT    Recommendations for Other Services       Precautions / Restrictions Precautions Precautions: Fall Restrictions Weight Bearing Restrictions: Yes LLE Weight Bearing: Partial weight bearing LLE Partial Weight Bearing Percentage or Pounds: 75-80%    Mobility  Bed Mobility Overal bed mobility: Needs Assistance Bed Mobility: Supine to Sit     Supine to sit: +2 for physical assistance;Mod assist Sit to supine: Mod assist;+2 for physical assistance   General bed mobility comments: Assistance to bring LE's over EOB; pt able to control upper body and bridge with unilateral LE to position himself in bed  Transfers Overall transfer level: Needs assistance Equipment used: Rolling walker (2 wheeled) Transfers: Sit to/from  [employer] Transfers Sit to Stand: +2 physical assistance;+2 safety/equipment;Mod assist Stand pivot transfers: Min assist       General transfer comment: Very slow to rise, required physical assist to stand upright.  Pt performed transfer slowly, minding WB precautions and responding to VC's for management of RW.  Ambulation/Gait                 Stairs             Wheelchair Mobility    Modified Rankin (Stroke Patients Only)       Balance Overall balance assessment: Needs assistance Sitting-balance support: Feet supported Sitting balance-Leahy Scale: Fair     Standing balance support: Bilateral upper extremity supported Standing balance-Leahy Scale: Poor                              Cognition Arousal/Alertness: Awake/alert Behavior During Therapy: WFL for tasks assessed/performed Overall Cognitive Status: Within Functional Limits for tasks assessed                                 General Comments: Pt slightly lethargic as pt was asleep when PT and OT entered room.      Exercises Total Joint Exercises Ankle Circles/Pumps: Strengthening;Both;Seated;10 reps Long Arc Quad: Strengthening;Both;10 reps;Seated Marching in Standing: Strengthening;Both;10 reps;Seated Other Exercises Other Exercises: Education regarding WB status, benefit of rehab and  importance of HEP and hydrating with water x5 min Other Exercises: Pt and caregiver (mother) educated on need for functional mobility within pt pain tolerance to improve overall strength and mobility and prevent deconditioning 2/2 to immobility.  Other Exercises: Pt and caregiver (mother) educated on falls prevention strategies including pet mgmt in order to improve safety upon DC. Other Exercises: Assisted pt (with help of PT) with functional transfer from room recliner back to bed. Pt required +2 mod assist on this date.     General Comments        Pertinent Vitals/Pain Pain  Score: 9  Pain Location: L hip Pain Descriptors / Indicators: Grimacing Pain Intervention(s): Limited activity within patient's tolerance;Monitored during session    Home Living                      Prior Function            PT Goals (current goals can now be found in the care plan section) Acute Rehab PT Goals Patient Stated Goal: to go home PT Goal Formulation: With patient Time For Goal Achievement: 09/27/18 Potential to Achieve Goals: Good Progress towards PT goals: Not progressing toward goals - comment(Pt limited by hip pain.  Will need closer supervision and rehab prior to going home.)    Frequency    BID      PT Plan Current plan remains appropriate    Co-evaluation   Reason for Co-Treatment: Complexity of the patient's impairments (multi-system involvement)   OT goals addressed during session: ADL's and self-care;Proper use of Adaptive equipment and DME      AM-PAC PT "6 Clicks" Mobility   Outcome Measure  Help needed turning from your back to your side while in a flat bed without using bedrails?: A Lot Help needed moving from lying on your back to sitting on the side of a flat bed without using bedrails?: A Lot Help needed moving to and from a bed to a chair (including a wheelchair)?: A Lot Help needed standing up from a chair using your arms (e.g., wheelchair or bedside chair)?: A Lot Help needed to walk in hospital room?: Total Help needed climbing 3-5 steps with a railing? : Total 6 Click Score: 10    End of Session Equipment Utilized During Treatment: Gait belt Activity Tolerance: Patient limited by pain Patient left: in bed;with family/visitor present;with nursing/sitter in room Nurse Communication: Mobility status PT Visit Diagnosis: Unsteadiness on feet (R26.81);Pain Pain - Right/Left: Left Pain - part of body: Hip     Time: [id number] PT Time Calculation (min) (ACUTE ONLY): 21 min  Charges:  $Therapeutic Activity: 8-22 mins                      Glenetta HewSarah Yardley Beltran, PT, DPT    Glenetta HewSarah Eilee Schader 09/14/2018, 5:21 PM

## 2018-09-14 NOTE — Progress Notes (Signed)
Rapid Response called pts BP 85/47. Katie, ICU charge nurse, [facility] and [facility] at bedside. MD Rosita Kea notified. Orders received for 1 Liter NS bolus. NS infusing.

## 2018-09-14 NOTE — Progress Notes (Signed)
Subjective: 2 Days Post-Op Procedure(s) (LRB): OPEN REDUCTION INTERNAL FIXATION (ORIF) PERIPROSTHETIC FRACTURE (Left) Patient reports pain as moderate.   Patient is well but has been tachycardic. PT and Care management to assist with discharge planning, current plan is discharge to SNF. Negative for chest pain and shortness of breath Fever: no Gastrointestinal:Negative for nausea and vomiting  Objective: Vital signs in last 24 hours: Temp:  [98.3 F (36.8 C)-99.6 F (37.6 C)] 98.3 F (36.8 C) (02/08 0850) Pulse Rate:  [100-116] 111 (02/08 0850) Resp:  [18-20] 20 (02/08 0850) BP: (107-142)/(70-89) 133/78 (02/08 0950) SpO2:  [93 %-95 %] 94 % (02/08 0850)  Intake/Output from previous day:  Intake/Output Summary (Last 24 hours) at 09/14/2018 1014 Last data filed at 09/14/2018 0600 Gross per 24 hour  Intake 1956.75 ml  Output 2050 ml  Net -93.25 ml    Intake/Output this shift: No intake/output data recorded.  Labs: Recent Labs    09/12/18 2119 09/13/18 0339 09/14/18 0418  HGB 12.5* 12.2* 11.2*   Recent Labs    09/13/18 0339 09/14/18 0418  WBC 11.9* 11.9*  RBC 4.18* 3.80*  HCT 37.9* 33.5*  PLT 247 235   Recent Labs    09/13/18 0339 09/14/18 0418  NA 137 135  K 3.9 3.8  CL 100 101  CO2 31 28  BUN 7 7  CREATININE 0.83 0.74  GLUCOSE 109* 116*  CALCIUM 8.5* 8.2*   No results for input(s): LABPT, INR in the last 72 hours.  EXAM General - Patient is Alert, Appropriate and Oriented Extremity - ABD soft Sensation intact distally Intact pulses distally Dorsiflexion/Plantar flexion intact Incision: dressing C/D/I No cellulitis present Dressing/Incision - Woundvac intact to the left hip, mild bloody drainage. Motor Function - intact, moving foot and toes well on exam.  Abdomen soft with normal BS this AM.  Past Medical History:  Diagnosis Date  . Arthritis    hands, foot, arm  . COPD (chronic obstructive pulmonary disease) (HCC)   . Depression   . GERD  (gastroesophageal reflux disease)   . Hypercholesteremia   . 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   Weight as of this encounter: 131.4 kg. Advance diet Up with therapy  Labs reviewed this AM, Hg 11.2 this AM. CBC and BMP ordered for tomorrow morning. HR 111 this AM, denies any chest pain or SOB.  BP stable, pain score not increased compared to yesterday.  Continue to monitor HR today and monitor when ambulating with PT. CBC and BMP ordered for tomorrow morning. Continue to work on BM. Possible d/c to SNF tomorrow.  DVT Prophylaxis - Lovenox, Foot Pumps and TED hose 80% weightbearing to the left leg with therapy.  Valeria Batman, PA-C [facility] 09/14/2018, 10:14 AM

## 2018-09-14 NOTE — Progress Notes (Signed)
Pt very alert now. Up and conversing. Talking to family on the phone.

## 2018-09-14 NOTE — Progress Notes (Signed)
Occupational Therapy Treatment Patient Details Name: Richard Davis MRN: [id number] DOB: [birth year] Today's Date: 09/14/2018    History of present illness Patient is 43 yo male s/p ORIF of L hip due to periprosthetic fracture with PMH of COPD, HTN, GERD, depression. Order for PWB, 75-80% on LLE.   OT comments  Pt seen for OT/PT co-treatment on this date. Upon arrival to room pt easily awoken. Pt seated upright in recliner with caregiver (mother) present. Pt reporting 9/10 pain in L hip. Pt agreeable to tx. Pt requiring +2 mod assist for sit<>stand transfer from recliner to bed on this date. Pt appears to have had some functional decline since OT evaluation. Pt unable to maintain seated balance for functional activity without min assist. Pt and caregiver instructed in pet mgmt as falls prevention strategy for improved safety upon DC (see exercise list below for details). Caregiver verbalized understanding instruction provided. Pt participation in session limited by fatigue after functional transfer. At end of session nursing in room for regular vital check, RN notified therapist that pt's BP was 74/48. Pt had not indicated feeling dizzy or light headed with activity. Will continue to monitor vitals with activity during future OT sessions. Pt making progress toward goals. Will continue to follow POC. Discharge recommendation remains appropriate.    Follow Up Recommendations  SNF    Equipment Recommendations  3 in 1 bedside commode    Recommendations for Other Services      Precautions / Restrictions Precautions Precautions: Fall Restrictions Weight Bearing Restrictions: Yes LLE Weight Bearing: Partial weight bearing LLE Partial Weight Bearing Percentage or Pounds: 75-80%       Mobility Bed Mobility Overal bed mobility: Needs Assistance Bed Mobility: Supine to Sit     Supine to sit: +2 for physical assistance;Mod assist Sit to supine: Mod assist;+2 for physical assistance    General bed mobility comments: verbal cues for technique  Transfers Overall transfer level: Needs assistance Equipment used: Rolling walker (2 wheeled) Transfers: Sit to/from Stand Sit to Stand: +2 physical assistance;+2 safety/equipment;Mod assist         General transfer comment: dependance on staff especially for stand to sit descent.  generally cautious with movement.    Balance Overall balance assessment: Needs assistance Sitting-balance support: Feet supported Sitting balance-Leahy Scale: Fair     Standing balance support: Bilateral upper extremity supported Standing balance-Leahy Scale: Poor                             ADL either performed or assessed with clinical judgement   ADL Overall ADL's : Needs assistance/impaired Eating/Feeding: Sitting;Independent   Grooming: Sitting;Set up   Upper Body Bathing: Sitting;Set up;Moderate assistance   Lower Body Bathing: Sit to/from stand;Moderate assistance;+2 for physical assistance   Upper Body Dressing : Sitting;Set up;Min guard   Lower Body Dressing: Sit to/from stand;Moderate assistance;+2 for physical assistance   Toilet Transfer: RW;Ambulation;BSC;+2 for physical assistance;Moderate assistance             General ADL Comments: Pt with significant decrease in functional independence in ADLs on this date. Pt endorsed feeling very limited by pain at time of the tx session as well as feeling tired from tx with PT in the AM. Encouraged pt to keep up with his in-room exercises to maintain strength and functional independence while in the hospital.      Vision Baseline Vision/History: Wears glasses Wears Glasses: At all times Patient Visual Report: No change  from baseline     Perception     Praxis      Cognition Arousal/Alertness: Awake/alert Behavior During Therapy: WFL for tasks assessed/performed Overall Cognitive Status: Within Functional Limits for tasks assessed                                           Exercises Other Exercises Other Exercises: Pt and caregiver (mother) educated on need for functional mobility within pt pain tolerance to improve overall strength and mobility and prevent deconditioning 2/2 to immobility.  Other Exercises: Pt and caregiver (mother) educated on falls prevention strategies including pet mgmt in order to improve safety upon DC. Other Exercises: Assisted pt (with help of PT) with functional transfer from room recliner back to bed. Pt required +2 mod assist on this date.    Shoulder Instructions       General Comments      Pertinent Vitals/ Pain       Pain Score: 9  Pain Location: L hip at rest Pain Descriptors / Indicators: Aching;Sharp;Grimacing;Guarding Pain Intervention(s): Limited activity within patient's tolerance;Monitored during session;Repositioned;Patient requesting pain meds-RN notified;Ice applied  Home Living                                          Prior Functioning/Environment              Frequency  Min 2X/week        Progress Toward Goals  OT Goals(current goals can now be found in the care plan section)  Progress towards OT goals: Progressing toward goals  Acute Rehab OT Goals Patient Stated Goal: to go home OT Goal Formulation: With patient Time For Goal Achievement: 09/27/18 Potential to Achieve Goals: Good  Plan Discharge plan remains appropriate    Co-treatment    PT/OT/SLP Co-Evaluation/Treatment: Yes Reason for Co-Treatment: For patient/therapist safety;To address functional/ADL transfers   OT goals addressed during session: ADL's and self-care;Proper use of Adaptive equipment and DME      AM-PAC OT "6 Clicks" Daily Activity     Outcome Measure   Help from another person eating meals?: A Little Help from another person taking care of personal grooming?: A Little Help from another person toileting, which includes using toliet, bedpan, or urinal?: A  Lot Help from another person bathing (including washing, rinsing, drying)?: A Lot Help from another person to put on and taking off regular upper body clothing?: A Little Help from another person to put on and taking off regular lower body clothing?: A Lot 6 Click Score: 15    End of Session Equipment Utilized During Treatment: Gait belt;Rolling walker  OT Visit Diagnosis: Other abnormalities of gait and mobility (R26.89);Muscle weakness (generalized) (M62.81);Pain Pain - Right/Left: Left Pain - part of body: Hip   Activity Tolerance Patient limited by pain;Patient limited by fatigue   Patient Left in bed;with call bell/phone within reach;with bed alarm set;with nursing/sitter in room;with SCD's reapplied;with family/visitor present   Nurse Communication Patient requests pain meds        Time: 7001-7494 OT Time Calculation (min): 32 min  Charges: OT General Charges $OT Visit: 1 Visit OT Treatments $Self Care/Home Management : 8-22 mins  Rockney Ghee, M.S., OTR/L Ascom: [phone] 09/14/18, 3:51 PM

## 2018-09-15 LAB — CBC
HCT: 33.8 % — ABNORMAL LOW (ref 39.0–52.0)
HEMOGLOBIN: 11 g/dL — AB (ref 13.0–17.0)
MCH: 29.1 pg (ref 26.0–34.0)
MCHC: 32.5 g/dL (ref 30.0–36.0)
MCV: 89.4 fL (ref 80.0–100.0)
Platelets: 241 10*3/uL (ref 150–400)
RBC: 3.78 MIL/uL — ABNORMAL LOW (ref 4.22–5.81)
RDW: 13.3 % (ref 11.5–15.5)
WBC: 11.7 10*3/uL — ABNORMAL HIGH (ref 4.0–10.5)
nRBC: 0 % (ref 0.0–0.2)

## 2018-09-15 MED ORDER — HYDROCODONE-ACETAMINOPHEN 5-325 MG PO TABS: 1.0000 | ORAL_TABLET | Freq: Four times a day (QID) | ORAL | 0 refills | Status: DC | PRN

## 2018-09-15 MED ORDER — ENOXAPARIN SODIUM 40 MG/0.4ML ~~LOC~~ SOLN: 40.0000 mg | Freq: Two times a day (BID) | SUBCUTANEOUS | 0 refills | Status: DC

## 2018-09-15 NOTE — Progress Notes (Signed)
Pt alert and oriented. Up most of the night. Refusing anything for bowels at this time. Pt was educated that he must produce BM before he is able to discharge.

## 2018-09-15 NOTE — Clinical Social Work Note (Signed)
The CSW contacted the patient's mother to discuss imminent discharge to the SNF [facility]). The patient's mother protested the discharge and shared that her son would like to go home but needs "a few more days to get better with PT in the hospital." The CSW explained that per Medicare guidelines, the patient no longer has acute needs for a hospital stay, and inpatient PT is for evaluative purposes more so than treatment. The CSW also explained that the patient has a wound vac that will need to be monitored, which is better observed in a SNF setting. The CSW advised the patient's mother that they have the right to appeal the discharge; however, they would be at risk of having a bill for the time during which the patient remains for the appeal process. The CSW explained to the patient's mother that the patient has the right to refuse transfer to SNF; however, HHPT would not be working with the patient daily. The patient's mother conceded to the discharge plan. The CSW answered her remaining questions (address for the SNF and "How Long Will He Be There?").  The facility is aware of the discharge and has received the discharge paperwork. The CSW has delivered the discharge packet and is signing off. Please consult should needs arise.  Argentina Ponder, MSW, [facility] [phone]

## 2018-09-15 NOTE — Discharge Instructions (Signed)
Diet: As you were doing prior to hospitalization   Shower:  May shower but keep the wounds dry, use an occlusive plastic wrap, NO SOAKING IN TUB.  If the bandage gets wet, change with a clean dry gauze.  Dressing:  You may change your dressing as needed. Change the dressing with sterile gauze dressing.    Activity:  Increase activity slowly as tolerated, but follow the weight bearing instructions below.  No lifting or driving for 6 weeks.  Weight Bearing:   80% weightbearing to the left leg.  To prevent constipation: you may use a stool softener such as -  Colace (over the counter) 100 mg by mouth twice a day  Drink plenty of fluids (prune juice may be helpful) and high fiber foods Miralax (over the counter) for constipation as needed.    Itching:  If you experience itching with your medications, try taking only a single pain pill, or even half a pain pill at a time.  You may take up to 10 pain pills per day, and you can also use benadryl over the counter for itching or also to help with sleep.   Precautions:  If you experience chest pain or shortness of breath - call 911 immediately for transfer to the hospital emergency department!!  If you develop a fever greater that 101 F, purulent drainage from wound, increased redness or drainage from wound, or calf pain-Call [facility]                                              Follow- Up Appointment:  Please call for an appointment to be seen in 2 weeks at [facility]

## 2018-09-15 NOTE — Progress Notes (Signed)
Home medication [place] returned back to patient.

## 2018-09-15 NOTE — Progress Notes (Signed)
EMS at bedside for transport. Discharge education reviewed with patient and family.

## 2018-09-15 NOTE — Progress Notes (Signed)
Report called to [facility] health care RN Reece Leader. Patient going to room 37 . EMS notified for transport.

## 2018-09-15 NOTE — Discharge Summary (Signed)
Physician Discharge Summary  Patient ID: Richard Davis MRN: [id number] DOB/AGE: 08/26/1975 43 y.o.  Admit date: 09/09/2018 Discharge date: 09/15/2018  Admission Diagnoses:  Closed fracture of left hip, initial encounter (HCC) [S72.002A] Left periprosthetic proximal femur fracture  Discharge Diagnoses: Patient Active Problem List   Diagnosis Date Noted  . Periprosthetic fracture around internal prosthetic hip joint 09/09/2018  . Avascular necrosis (HCC) 12/07/2016  . Leg pain 10/23/2016  . Left hip pain 10/05/2016  . Hypogonadism in male 03/27/2016  . Surgical complication involving left eye associated with non-ophthalmic procedure 06/17/2015  . Hx of cardiac catheterization 02/03/2015  . 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 Chest pain with high risk for cardiac etiology 01/27/2015  . Abnormal nuclear stress test 01/27/2015  . Chronic pain 01/27/2015  . GERD (gastroesophageal reflux disease) 01/27/2015  . Hyperlipidemia 01/27/2015  . Hypertension 01/27/2015  . Joint pain 01/27/2015  . Sinusitis 01/27/2015  . Tobacco use 01/27/2015  . SOB (shortness of breath) on exertion 12/31/2014  . Gynecomastia 02/10/2013  . Schizoaffective disorder (HCC) 06/04/2012    Past Medical History:  Diagnosis Date  . Arthritis    hands, foot, arm  . COPD (chronic obstructive pulmonary disease) (HCC)   . Depression   . GERD (gastroesophageal reflux disease)   . Hypercholesteremia   . Hypertension      Transfusion: None.   Consultants (if any):   Discharged Condition: Improved  Hospital Course: Richard CoverMichael B Paster is an 43 y.o. male who was admitted 09/09/2018 with a diagnosis of a left periprosthetic proximal hip fracture and went to the operating room on 09/12/2018 and underwent the above named procedures.    Surgeries: Procedure(s): OPEN REDUCTION INTERNAL FIXATION (ORIF) PERIPROSTHETIC FRACTURE on 09/12/2018 Patient tolerated the surgery well. Taken to PACU where she  was stabilized and then transferred to the orthopedic floor.  Started on Lovenox 40mg  q 12 hrs. Foot pumps applied bilaterally at 80 mm. Heels elevated on bed with rolled towels. No evidence of DVT. Negative Homan. Physical therapy started on day #1 for gait training and transfer. OT started day #1 for ADL and assisted devices.  Patient's IV was removed on POD3.   Foley removed on POD1. Woundvac continued upon discharge.  Implants: Synthes periprosthetic LCDC plate with cerclage positioning pins x4 cable ready cable grip system from [employer] x4 and 2 cortical screws  He was given perioperative antibiotics:  Anti-infectives (From admission, onward)   Start     Dose/Rate Route Frequency Ordered Stop   09/12/18 2300  ceFAZolin (ANCEF) IVPB 2g/100 mL premix     2 g 200 mL/hr over 30 Minutes Intravenous Every 6 hours 09/12/18 2110 09/13/18 1236   09/10/18 1600  ceFAZolin (ANCEF) IVPB 2g/100 mL premix     2 g 200 mL/hr over 30 Minutes Intravenous  Once 09/09/18 1952 09/12/18 1720    .  He was given sequential compression devices, early ambulation, and Lovenox for DVT prophylaxis.  He benefited maximally from the hospital stay and there were no complications.    Recent vital signs:  Vitals:   09/14/18 2306 09/15/18 0850  BP: 129/66 114/81  Pulse: (!) 102 (!) 109  Resp: 18 20  Temp: 98.4 F (36.9 C) 98 F (36.7 C)  SpO2: 92% 97%    Recent laboratory studies:  Lab Results  Component Value Date   HGB 11.0 (L) 09/15/2018   HGB 11.2 (L) 09/14/2018   HGB 12.2 (L) 09/13/2018   Lab Results  Component Value Date  WBC 11.7 (H) 09/15/2018   PLT 241 09/15/2018   Lab Results  Component Value Date   INR 1.03 09/09/2018   Lab Results  Component Value Date   NA 135 09/14/2018   K 3.8 09/14/2018   CL 101 09/14/2018   CO2 28 09/14/2018   BUN 7 09/14/2018   CREATININE 0.74 09/14/2018   GLUCOSE 116 (H) 09/14/2018    Discharge Medications:   Allergies as of 09/15/2018   No Known  Allergies     Medication List    TAKE these medications   ADVAIR HFA 115-21 MCG/ACT inhaler Generic drug:  fluticasone-salmeterol USE 2 INHALATIONS BID   albuterol 108 (90 Base) MCG/ACT inhaler Commonly known as:  PROVENTIL HFA;VENTOLIN HFA Inhale 2 puffs into the lungs every 6 (six) hours as needed for wheezing or shortness of breath.   amitriptyline 100 MG tablet Commonly known as:  ELAVIL Take 50-100 mg by mouth at bedtime.   amoxicillin-clavulanate 875-125 MG tablet Commonly known as:  AUGMENTIN Take 1 tablet by mouth every 12 (twelve) hours.   B-COMPLEX-C PO Take 1 tablet by mouth daily.   enoxaparin 40 MG/0.4ML injection Commonly known as:  LOVENOX Inject 0.4 mLs (40 mg total) into the skin every 12 (twelve) hours.   gabapentin 300 MG capsule Commonly known as:  NEURONTIN Take 300 mg by mouth 3 (three) times daily.   haloperidol 10 MG tablet Commonly known as:  HALDOL Take 5-10 mg by mouth 3 (three) times daily. Take one tablet by mouth every morning, and one-half tablet every afternoon and one tablet every night at bedtime   HYDROcodone-acetaminophen 5-325 MG tablet Commonly known as:  NORCO/VICODIN Take 1-2 tablets by mouth every 6 (six) hours as needed for moderate pain. What changed:  reasons to take this   lisinopril-hydrochlorothiazide 20-12.5 MG tablet Commonly known as:  PRINZIDE,ZESTORETIC Take 1 tablet by mouth daily.   METHADONE HCL PO Take 146 mg by mouth daily.   omeprazole 40 MG capsule Commonly known as:  PRILOSEC Take 40 mg by mouth daily.   QUEtiapine 400 MG 24 hr tablet Commonly known as:  SEROQUEL XR 400 mg 2 (two) times daily.   simvastatin 40 MG tablet Commonly known as:  ZOCOR Take 40 mg by mouth daily.   sodium chloride 0.65 % Soln nasal spray Commonly known as:  OCEAN Place 2 sprays into both nostrils every 2 (two) hours while awake.   traZODone 100 MG tablet Commonly known as:  DESYREL Take 300-400 mg by mouth at  bedtime.       Diagnostic Studies: Dg Chest 1 View  Result Date: 09/09/2018 CLINICAL DATA:  Preoperative left hip fracture. EXAM: CHEST  1 VIEW COMPARISON:  03/04/2014 FINDINGS: Shallow inspiration. Linear fibrosis in the left lung base. Blunting of left costophrenic angle is unchanged since previous study suggesting pleural thickening. No airspace disease or consolidation. No pneumothorax. Mediastinal contours appear intact. IMPRESSION: Chronic scarring and pleural thickening in the left base. No active disease. Electronically Signed   By: Burman NievesWilliam  Stevens M.D.   On: 09/09/2018 19:14   Dg C-arm 1-60 Min  Result Date: 09/12/2018 CLINICAL DATA:  Left IM nailing EXAM: LEFT FEMUR 2 VIEWS; DG C-ARM 61-120 MIN COMPARISON:  None. FINDINGS: Intraoperative fluoroscopy is utilized for surgical control purposes. Fluoroscopy time is recorded at 45 seconds. Dose area product was 204 cGy cm square. Air kerma was 13.4 mGy. Two spot fluoroscopic images are obtained. Spot fluoroscopic images of the midshaft region of the left femur demonstrate  plate and screw with cerclage wire fixation laterally. IMPRESSION: Intraoperative fluoroscopy utilized for surgical control purposes. Electronically Signed   By: Burman Nieves M.D.   On: 09/12/2018 20:22   Dg Hip Unilat W Or W/o Pelvis 2-3 Views Left  Result Date: 09/12/2018 CLINICAL DATA:  S/p ORIF left hip. EXAM: DG HIP (WITH OR WITHOUT PELVIS) 2-3V LEFT COMPARISON:  09/09/2018 FINDINGS: Postoperative changes with left total hip arthroplasty. Additional plate and screw and cerclage fixation of the lateral femoral shaft. No dislocation of the prosthesis. Components appear well seated. Skin clips and soft tissue gas consistent with recent surgery. IMPRESSION: Postoperative left total hip arthroplasty and plate and screw fixation of the femoral shaft. Components appear well seated. Electronically Signed   By: Burman Nieves M.D.   On: 09/12/2018 20:20   Dg Hip Unilat W Or Wo  Pelvis 2-3 Views Left  Result Date: 09/09/2018 CLINICAL DATA:  Larey Seat to the left side. Previous left hip replacement 8 years ago. Unable to straighten the left leg. EXAM: DG HIP (WITH OR WITHOUT PELVIS) 2-3V LEFT COMPARISON:  None. FINDINGS: Postoperative left total hip arthroplasty using non cemented components. There is an oblique subtrochanteric periprosthetic fracture of the proximal left femur with mild medial angulation of the distal fracture fragment. No prosthetic dislocation. Mild deformity of the superior pubic ramus on the left likely represents old fracture deformity. Degenerative changes in the right hip with sclerosis and irregularity of the cortical surface on the femoral head. This could be due to degenerative change or avascular necrosis. IMPRESSION: Acute posttraumatic subtrochanteric fracture of the proximal left femur. Left total hip arthroplasty. Electronically Signed   By: Burman Nieves M.D.   On: 09/09/2018 19:12   Dg Femur Min 2 Views Left  Result Date: 09/12/2018 CLINICAL DATA:  Left IM nailing EXAM: LEFT FEMUR 2 VIEWS; DG C-ARM 61-120 MIN COMPARISON:  None. FINDINGS: Intraoperative fluoroscopy is utilized for surgical control purposes. Fluoroscopy time is recorded at 45 seconds. Dose area product was 204 cGy cm square. Air kerma was 13.4 mGy. Two spot fluoroscopic images are obtained. Spot fluoroscopic images of the midshaft region of the left femur demonstrate plate and screw with cerclage wire fixation laterally. IMPRESSION: Intraoperative fluoroscopy utilized for surgical control purposes. Electronically Signed   By: Burman Nieves M.D.   On: 09/12/2018 20:22    Disposition: Plan for discharge to SNF today.     Contact information for follow-up providers    Kennedy Bucker, MD Follow up in 14 day(s).   Specialty:  Orthopedic Surgery Why:  Staple Removal and x-rays  Contact information: [address] [facility] [place]  [address] [phone]            Contact information for after-discharge care    Destination    [facility] Preferred SNF .   Service:  Skilled Nursing Contact information: [address] [place] [address] [phone]                 Signed: Meriel Pica PA-C 09/15/2018, 9:36 AM

## 2018-09-15 NOTE — Progress Notes (Signed)
Subjective: 3 Days Post-Op Procedure(s) (LRB): OPEN REDUCTION INTERNAL FIXATION (ORIF) PERIPROSTHETIC FRACTURE (Left) Patient reports pain as moderate.   Patient is well, hypotension improved this AM. Plan is for discharge to SNF. Negative for chest pain and shortness of breath Fever: no Gastrointestinal:Negative for nausea and vomiting  Objective: Vital signs in last 24 hours: Temp:  [98 F (36.7 C)-98.4 F (36.9 C)] 98 F (36.7 C) (02/09 0850) Pulse Rate:  [98-109] 109 (02/09 0850) Resp:  [18-20] 20 (02/09 0850) BP: (85-133)/(47-81) 114/81 (02/09 0850) SpO2:  [88 %-100 %] 97 % (02/09 0850) FiO2 (%):  [28 %] 28 % (02/08 1625)  Intake/Output from previous day:  Intake/Output Summary (Last 24 hours) at 09/15/2018 0932 Last data filed at 09/15/2018 0459 Gross per 24 hour  Intake 720 ml  Output 1800 ml  Net -1080 ml    Intake/Output this shift: No intake/output data recorded.  Labs: Recent Labs    09/12/18 2119 09/13/18 0339 09/14/18 0418 09/15/18 0306  HGB 12.5* 12.2* 11.2* 11.0*   Recent Labs    09/14/18 0418 09/15/18 0306  WBC 11.9* 11.7*  RBC 3.80* 3.78*  HCT 33.5* 33.8*  PLT 235 241   Recent Labs    09/13/18 0339 09/14/18 0418  NA 137 135  K 3.9 3.8  CL 100 101  CO2 31 28  BUN 7 7  CREATININE 0.83 0.74  GLUCOSE 109* 116*  CALCIUM 8.5* 8.2*   No results for input(s): LABPT, INR in the last 72 hours.  EXAM General - Patient is Alert, Appropriate and Oriented Extremity - ABD soft Sensation intact distally Intact pulses distally Dorsiflexion/Plantar flexion intact Incision: dressing C/D/I No cellulitis present Dressing/Incision - Woundvac intact to the left hip, mild bloody drainage. Motor Function - intact, moving foot and toes well on exam.  Abdomen soft with normal BS this AM.  Past Medical History:  Diagnosis Date  . Arthritis    hands, foot, arm  . COPD (chronic obstructive pulmonary disease) (HCC)   . Depression   . GERD  (gastroesophageal reflux disease)   . Hypercholesteremia   . Hypertension     Assessment/Plan: 3 Days Post-Op Procedure(s) (LRB): OPEN REDUCTION INTERNAL FIXATION (ORIF) PERIPROSTHETIC FRACTURE (Left) Active Problems:   Periprosthetic fracture around internal prosthetic hip joint  Estimated body mass index is 40.39 kg/m as calculated from the following:   Height as of this encounter: 5\' 11"  (1.803 m).   Weight as of this encounter: 131.4 kg. Advance diet Up with therapy  Labs reviewed this AM, Hg 11.2 this AM. CBC and BMP ordered for tomorrow morning. HR 109 this AM, denies any chest pain or SOB.  BP stable, pain score not increased compared to yesterday.  Continue to monitor HR today and monitor when ambulating with PT. Continue to work on BM. Plan for discharge to SNF today.  DVT Prophylaxis - Lovenox, Foot Pumps and TED hose 80% weightbearing to the left leg with therapy.  Valeria Batman, PA-C [facility] Orthopaedic Surgery 09/15/2018, 9:32 AM

## 2018-09-17 NOTE — Anesthesia Postprocedure Evaluation (Signed)
Anesthesia Post Note  Patient: Richard CoverMichael B Davis  Procedure(s) Performed: OPEN REDUCTION INTERNAL FIXATION (ORIF) PERIPROSTHETIC FRACTURE (Left Hip)  Patient location during evaluation: PACU Anesthesia Type: General Level of consciousness: awake and alert Pain management: pain level controlled Vital Signs Assessment: post-procedure vital signs reviewed and stable Respiratory status: spontaneous breathing, nonlabored ventilation, respiratory function stable and patient connected to nasal cannula oxygen Cardiovascular status: blood pressure returned to baseline and stable Postop Assessment: no apparent nausea or vomiting Anesthetic complications: no     Last Vitals:  Vitals:   09/14/18 2306 09/15/18 0850  BP: 129/66 114/81  Pulse: (!) 102 (!) 109  Resp: 18 20  Temp: 36.9 C 36.7 C  SpO2: 92% 97%    Last Pain:  Vitals:   09/15/18 1215  TempSrc:   PainSc: 2                  Jovita GammaKathryn L Fitzgerald

## 2018-09-18 ENCOUNTER — Emergency Department
Admission: EM | Admit: 2018-09-18 | Discharge: 2018-09-19 | Disposition: A | Discharge status: Home / Self Care | Payer: Medicare Other | Attending: Emergency Medicine | Admitting: Emergency Medicine

## 2018-09-18 ENCOUNTER — Encounter: Payer: Self-pay | Admitting: Emergency Medicine

## 2018-09-18 DIAGNOSIS — Z96642 Presence of left artificial hip joint: Secondary | ICD-10-CM | POA: Insufficient documentation

## 2018-09-18 DIAGNOSIS — F17210 Nicotine dependence, cigarettes, uncomplicated: Secondary | ICD-10-CM | POA: Diagnosis not present

## 2018-09-18 DIAGNOSIS — J449 Chronic obstructive pulmonary disease, unspecified: Secondary | ICD-10-CM | POA: Insufficient documentation

## 2018-09-18 DIAGNOSIS — M25552 Pain in left hip: Secondary | ICD-10-CM | POA: Diagnosis not present

## 2018-09-18 DIAGNOSIS — Z79899 Other long term (current) drug therapy: Secondary | ICD-10-CM | POA: Diagnosis not present

## 2018-09-18 DIAGNOSIS — I10 Essential (primary) hypertension: Secondary | ICD-10-CM | POA: Diagnosis not present

## 2018-09-18 MED ORDER — HYDROCODONE-ACETAMINOPHEN 5-325 MG PO TABS
2.0000 | ORAL_TABLET | Freq: Four times a day (QID) | ORAL | Status: DC | PRN
  Administered 2018-09-18: 2 via ORAL
  Filled 2018-09-18: qty 2

## 2018-09-18 MED ORDER — WHEELCHAIR MISC: 0 refills | Status: DC

## 2018-09-18 MED ORDER — METHADONE HCL 10 MG/ML PO CONC
146.0000 mg | Freq: Every day | ORAL | Status: DC
  Filled 2018-09-18: qty 14.6

## 2018-09-18 MED ORDER — HYDROCODONE-ACETAMINOPHEN 5-325 MG PO TABS: 1.0000 | ORAL_TABLET | ORAL | 0 refills | Status: DC | PRN

## 2018-09-18 NOTE — ED Provider Notes (Addendum)
[facility] Emergency Department Provider Note  Time seen: 1:19 PM  I have reviewed the triage vital signs and the nursing notes.   HISTORY  Chief Complaint Hip Pain    HPI Richard Davis is a 43 y.o. male with a past medical history of arthritis, COPD, gastric reflux, hypertension, hyperlipidemia, chronic pain on methadone maintenance therapy who presents to the emergency department for placement to a rehab facility.  According to the patient and record review patient was seen in the emergency department 09/09/2018 for pain to his left hip.  Patient is 8 years status post left hip replacement.  Patient found to have periprosthetic fractures of the left hip and was admitted to the hospital.  Had a surgery 09/12/2018.  Patient was discharged to his current rehab facility 09/15/2018.  Patient takes daily methadone maintenance therapy, the rehab facility was unable to dose methadone so they sent the patient back to the emergency department.  Patient had 2 days left of methadone, took his tablet yesterday, but does not have methadone today.  Because they could not place the patient from the nursing facility in a timely fashion and were concerned that he could go into opiate withdrawal if they did not get his methadone was they are unable to get they sent him to the emergency department for placement.  Patient states severe left hip pain, denies any acute worsening of the pain.  No other medical complaints at this time.   Past Medical History:  Diagnosis Date  . Arthritis    hands, foot, arm  . COPD (chronic obstructive pulmonary disease) (HCC)   . Depression   . GERD (gastroesophageal reflux disease)   . Hypercholesteremia   . Hypertension     Patient Active Problem List   Diagnosis Date Noted  . Periprosthetic fracture around internal prosthetic hip joint 09/09/2018  . Avascular necrosis (HCC) 12/07/2016  . Leg pain 10/23/2016  . Left hip pain 10/05/2016  .  Hypogonadism in male 03/27/2016  . Surgical complication involving left eye associated with non-ophthalmic procedure 06/17/2015  . Hx of cardiac catheterization 02/03/2015  . 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 Chest pain with high risk for cardiac etiology 01/27/2015  . Abnormal nuclear stress test 01/27/2015  . Chronic pain 01/27/2015  . GERD (gastroesophageal reflux disease) 01/27/2015  . Hyperlipidemia 01/27/2015  . Hypertension 01/27/2015  . Joint pain 01/27/2015  . Sinusitis 01/27/2015  . Tobacco use 01/27/2015  . SOB (shortness of breath) on exertion 12/31/2014  . Gynecomastia 02/10/2013  . Schizoaffective disorder (HCC) 06/04/2012    Past Surgical History:  Procedure Laterality Date  . CARDIAC CATHETERIZATION N/A 01/27/2015   Procedure: Left Heart Cath;  Surgeon: Marcina MillardAlexander Paraschos, MD;  [facility];  Service: Cardiovascular;  Laterality: N/A;  . ETHMOIDECTOMY Left 06/17/2015   Procedure: ETHMOIDECTOMY REVISION and removal of polyps.;  Surgeon: Vernie MurdersPaul Juengel, MD;  [facility];  Service: ENT;  Laterality: Left;  . FRONTAL SINUS EXPLORATION Left 06/17/2015   Procedure: FRONTAL Sinusotomy with removal of polyps.;  Surgeon: Vernie MurdersPaul Juengel, MD;  [facility];  Service: ENT;  Laterality: Left;  . IMAGE GUIDED SINUS SURGERY N/A 06/17/2015   Procedure: IMAGE GUIDED SINUS SURGERY;  Surgeon: Vernie MurdersPaul Juengel, MD;  [facility];  Service: ENT;  Laterality: N/A;  GAVE DISK TO [name] PROPEL Gave 2nd disk to cece 10-24 kp  . JOINT REPLACEMENT    . MAXILLARY ANTROSTOMY Left 06/17/2015  Procedure: ENDOSCOPIC MAXILLARY ANTROSTOMY WITH REMOVAL CONTENTS;  Surgeon: Vernie Murders, MD;  Location: [facility];  Service: ENT;  Laterality: Left;  . ORIF PERIPROSTHETIC FRACTURE Left 09/12/2018   Procedure: OPEN REDUCTION INTERNAL FIXATION (ORIF) PERIPROSTHETIC FRACTURE;  Surgeon: Kennedy Bucker, MD;  Location:  [facility] ORS;  Service: Orthopedics;  Laterality: Left;  . POLYPECTOMY Left 06/17/2015   Procedure: POLYPECTOMY NASAL;  Surgeon: Vernie Murders, MD;  Location: [facility];  Service: ENT;  Laterality: Left;  . TONSILLECTOMY      Prior to Admission medications   Medication Sig Start Date End Date Taking? Authorizing Provider  ADVAIR [facility] 115-21 MCG/ACT inhaler USE 2 INHALATIONS BID 03/30/17   [provider]  albuterol (PROVENTIL HFA;VENTOLIN HFA) 108 (90 BASE) MCG/ACT inhaler Inhale 2 puffs into the lungs every 6 (six) hours as needed for wheezing or shortness of breath.    [provider]  amitriptyline (ELAVIL) 100 MG tablet Take 50-100 mg by mouth at bedtime.     [provider]  amoxicillin-clavulanate (AUGMENTIN) 875-125 MG tablet Take 1 tablet by mouth every 12 (twelve) hours. Patient not taking: Reported on 09/09/2018 04/14/18   Lutricia Feil, PA-C  B-COMPLEX-C PO Take 1 tablet by mouth daily.    [provider]  enoxaparin (LOVENOX) 40 MG/0.4ML injection Inject 0.4 mLs (40 mg total) into the skin every 12 (twelve) hours. 09/15/18   Anson Oregon, PA-C  gabapentin (NEURONTIN) 300 MG capsule Take 300 mg by mouth 3 (three) times daily.    [provider]  haloperidol (HALDOL) 10 MG tablet Take 5-10 mg by mouth 3 (three) times daily. Take one tablet by mouth every morning, and one-half tablet every afternoon and one tablet every night at bedtime    [provider]  HYDROcodone-acetaminophen (NORCO/VICODIN) 5-325 MG tablet Take 1-2 tablets by mouth every 6 (six) hours as needed for moderate pain. 09/15/18   Anson Oregon, PA-C  lisinopril-hydrochlorothiazide (PRINZIDE,ZESTORETIC) 20-12.5 MG tablet Take 1 tablet by mouth daily.    [provider]  METHADONE HCL PO Take 146 mg by mouth daily.    [provider]  omeprazole (PRILOSEC) 40 MG capsule Take 40 mg by mouth daily. 12/05/17 12/06/18  [provider]   QUEtiapine (SEROQUEL XR) 400 MG 24 hr tablet 400 mg 2 (two) times daily.  03/13/17   [provider]  simvastatin (ZOCOR) 40 MG tablet Take 40 mg by mouth daily.    [provider]  sodium chloride (OCEAN) 0.65 % SOLN nasal spray Place 2 sprays into both nostrils every 2 (two) hours while awake. 01/18/18 02/17/18  Betancourt, Jarold Song, NP  traZODone (DESYREL) 100 MG tablet Take 300-400 mg by mouth at bedtime.     [provider]    No Known Allergies  Family History  Problem Relation Age of Onset  . Hypercholesterolemia Mother   . Crohn's disease Father     Social History Social History   Tobacco Use  . Smoking status: Current Every Day Smoker    Packs/day: 2.00    Years: 15.00    Pack years: 30.00    Types: Cigarettes  . Smokeless tobacco: Never Used  Substance Use Topics  . Alcohol use: No  . Drug use: No    Review of Systems Constitutional: Negative for fever. Cardiovascular: Negative for chest pain. Respiratory: Negative for shortness of breath. Gastrointestinal: Negative for abdominal pain Musculoskeletal: Left hip pain Skin: Wound VAC in place over left hip. Neurological: Negative for  headache All other ROS negative  ____________________________________________   PHYSICAL EXAM:  VITAL SIGNS: ED Triage Vitals  Enc Vitals Group     BP 09/18/18 1309 136/80     Pulse Rate 09/18/18 1309 (!) 108     Resp 09/18/18 1309 20     Temp 09/18/18 1309 98.7 F (37.1 C)     Temp Source 09/18/18 1309 Oral     SpO2 09/18/18 1309 96 %     Weight 09/18/18 1310 260 lb (117.9 kg)     Height 09/18/18 1310 5\' 11"  (1.803 m)     Head Circumference --      Peak Flow --      Pain Score 09/18/18 1309 9     Pain Loc --      Pain Edu? --      Excl. in GC? --    Constitutional: Alert and oriented. Well appearing and in no distress. Eyes: Normal exam ENT   Head: Normocephalic and atraumatic.   Mouth/Throat: Mucous membranes are  moist. Cardiovascular: Normal rate, regular rhythm.  Respiratory: Normal respiratory effort without tachypnea nor retractions. Breath sounds are clear  Gastrointestinal: Soft and nontender. No distention.  Musculoskeletal: Left hip pain, wound VAC in place.  We will leave intact. Neurologic:  Normal speech and language. No gross focal neurologic deficits  Skin:  Skin is warm, dry and intact.  Psychiatric: Mood and affect are normal.  ____________________________________________   INITIAL IMPRESSION / ASSESSMENT AND PLAN / ED COURSE  Pertinent labs & imaging results that were available during my care of the patient were reviewed by me and considered in my medical decision making (see chart for details).  Patient presents to the emergency department from his rehab facility since they are unable to dose his chronic methadone.  Patient last received methadone yesterday.  We will dose the patient's home methadone dose of 146 mg oral liquid.  I have verified this on the patient's last H&P as well as care everywhere family medicine notes.  We will also dose Vicodin as he was prescribed by his orthopedist for pain if needed every 6 hours.  We will discuss with social work for placement.  Social work is aware of the patient and currently working towards placement or returning the patient to [facility].  Patient care signed out to oncoming physician.  Patient states he does not want to go back to [facility].  States he would rather just go home if we can arrange for home PT and home health care.  Patient would need a wheelchair.  I believe this is a reasonable plan of care.  I have filled out a home health/face-to-face form for the patient.  Case manager will be down shortly to see the patient.  I will write a prescription for his Norco going home.  Patient will follow-up with orthopedics.  Patient suffers from hip pain status post left hip surgery which impairs their ability to  perform daily activities like feeding, dressing, grooming, bathing in the home.  A walker will not resolve these issues with performing these activities of daily living.  A wheelchair will allow the patient to safely perform daily activities.  Patient can safely propel the wheelchair in the home or has a caregiver who can also help provide assistance.  ____________________________________________   FINAL CLINICAL IMPRESSION(S) / ED DIAGNOSES  Left hip pain    Minna Antis, MD 09/18/18 1508    Minna Antis, MD 09/18/18 1605    Minna Antis,  MD 09/18/18 1617

## 2018-09-18 NOTE — ED Notes (Signed)
Pain meds administered awaiting social worker consult and pain management placement.

## 2018-09-18 NOTE — ED Notes (Signed)
Patient reports in methadone program and received 146 mg on a daily basis through program. C/o of not receiving daily meds. Denies any other complaint. Patient s/p hip surgery wearing wound vac. Requested pain med for surgical pain. Md eval compketed. meds ordered from pharmacy. Family at bedside. Will continue to monitor.

## 2018-09-18 NOTE — ED Notes (Signed)
Patient reports took a dose of methadone today will not need today's dose. MD aware. Will continue to monitor. Family at bedside.

## 2018-09-18 NOTE — Progress Notes (Signed)
Per [facility] admissions coordinator at [facility] they can't accept patient back because their pharmacy can't administer the methadone because it is for opioid dependence. Clinical Social Worker (CSW) met with patient and his mother Vaughan Basta was at bedside. CSW made them aware of above. Per patient he does not want to go to [facility] and will go home. Patient requested wheel chair and home health. RN case Freight forwarder and MD aware of above. Please reconsult if future social work needs arise. CSW signing off.   [employer], LCSW [phone]

## 2018-09-18 NOTE — ED Notes (Signed)
This RN received phone call from [place] at [facility]. Per Alcario Drought, pt was sent to [facility] due to being on Methadone, per Alcario Drought, RN they are not allowed to administer the dose of methadone that patient is on, per Alcario Drought, recommended by Methadone clinic to send to the hospital for dose of Methadone and per Alcario Drought, pt is now needing placement to a facility that can administer pt's dose of methadone.

## 2018-09-18 NOTE — Progress Notes (Signed)
Clinical Child psychotherapist (CSW) contacted [facility] to see if they can accept patient with methadone. CSW is waiting on a call back.   [employer], LCSW [phone]

## 2018-09-18 NOTE — ED Triage Notes (Signed)
Pt in via EMS from [facility]. EMS reports pt broke his left hip and had surgery last Thursday and was sent to [facility] afterwards. EMS reports per facility they are not able to provide him with the amount of methadone that he needs so they sent him here for placement in a facility that can give him the correct dose. Pt reports pain to his left hip. Pt also with a wound vac.

## 2018-09-18 NOTE — ED Notes (Signed)
Social worker consult completed. Plan is to d/c patient to home.

## 2018-09-18 NOTE — Discharge Instructions (Addendum)
You have been seen in the emergency department for left hip pain.  Please take your pain medication as needed, but only as prescribed.  Return to the emergency department for any acutely worsening pain.  In order for home health/home physical therapy has been placed.

## 2018-09-18 NOTE — ED Notes (Signed)
Physical therapy at bedside to provide patient with w/c and walker to use at home. perscription given to family member. Patient awaiting ems transport.

## 2018-09-18 NOTE — Care Management Note (Signed)
Case Management Note  Patient Details  Name: Richard Davis MRN: [id number] Date of Birth: Sep 03, 1975  Subjective/Objective:   Patient is being seen in the ED from [facility] for hip pain.  [facility] notified the LCSW that the patient will not be able to return to the facility, reason is that patient has a medication regimen with methadone that the facility cannot accommodate.  Patient is agreeable to going home with home health services.  MD order has been placed for home health RN, PT, OT, aide, and SW.  Choice offered to patient and patient's mother and they choose [facility].  Mellissa Kohut notified of referral.  DME for home use obtained, manual wheelchair and 3 in 1.  Patient is requesting to take home a urinal, which should not be a problem as they are disposable.   Robbie Lis RN BSN [phone]                     Action/Plan: Discharge from ED to Home with Home Health [facility] RN, PT, OT, aide and SW. DME obtained wheelchair and bedside commode.    Expected Discharge Date:                  Expected Discharge Plan:  Home w Home Health Services  In-House Referral:  Clinical Social Work  Discharge planning Services  CM Consult  Post Acute Care Choice:  Durable Medical Equipment, Home Health Choice offered to:  Patient, Parent  DME Arranged:  3-N-1, Government social research officer DME Agency:  [facility]  HH Arranged:  RN, PT, OT, Nurse's Aide, Social Work [employer] Agency:  [facility]  Status of Service:  Completed, signed off  If discussed at [employer], dates discussed:    Additional Comments:  Allayne Butcher, RN 09/18/2018, 4:35 PM

## 2018-09-19 LAB — DRUG PROFILE, UR, 9 DRUGS (LABCORP)
AMPHETAMINES, URINE: NEGATIVE ng/mL
Barbiturate, Ur: NEGATIVE ng/mL
Benzodiazepine Quant, Ur: NEGATIVE ng/mL
Cannabinoid Quant, Ur: NEGATIVE ng/mL
Cocaine (Metab.): NEGATIVE ng/mL
Methadone Screen, Urine: POSITIVE ng/mL — AB
Opiate Quant, Ur: POSITIVE ng/mL — AB
PROPOXYPHENE, URINE: NEGATIVE ng/mL
Phencyclidine, Ur: NEGATIVE ng/mL

## 2019-05-20 ENCOUNTER — Emergency Department: Payer: Medicare Other

## 2019-05-20 ENCOUNTER — Other Ambulatory Visit: Payer: Self-pay

## 2019-05-20 ENCOUNTER — Emergency Department
Admission: EM | Admit: 2019-05-20 | Discharge: 2019-05-20 | Disposition: A | Discharge status: Home / Self Care | Payer: Medicare Other | Attending: Student in an Organized Health Care Education/Training Program | Admitting: Student in an Organized Health Care Education/Training Program

## 2019-05-20 ENCOUNTER — Encounter: Payer: Self-pay | Admitting: Emergency Medicine

## 2019-05-20 DIAGNOSIS — W010XXA Fall on same level from slipping, tripping and stumbling without subsequent striking against object, initial encounter: Secondary | ICD-10-CM | POA: Diagnosis not present

## 2019-05-20 DIAGNOSIS — Y939 Activity, unspecified: Secondary | ICD-10-CM | POA: Diagnosis not present

## 2019-05-20 DIAGNOSIS — Z79899 Other long term (current) drug therapy: Secondary | ICD-10-CM | POA: Insufficient documentation

## 2019-05-20 DIAGNOSIS — Z7901 Long term (current) use of anticoagulants: Secondary | ICD-10-CM | POA: Insufficient documentation

## 2019-05-20 DIAGNOSIS — J449 Chronic obstructive pulmonary disease, unspecified: Secondary | ICD-10-CM | POA: Insufficient documentation

## 2019-05-20 DIAGNOSIS — S5001XA Contusion of right elbow, initial encounter: Secondary | ICD-10-CM

## 2019-05-20 DIAGNOSIS — Y999 Unspecified external cause status: Secondary | ICD-10-CM | POA: Diagnosis not present

## 2019-05-20 DIAGNOSIS — S59901A Unspecified injury of right elbow, initial encounter: Secondary | ICD-10-CM | POA: Diagnosis present

## 2019-05-20 DIAGNOSIS — F17210 Nicotine dependence, cigarettes, uncomplicated: Secondary | ICD-10-CM | POA: Diagnosis not present

## 2019-05-20 DIAGNOSIS — I10 Essential (primary) hypertension: Secondary | ICD-10-CM | POA: Insufficient documentation

## 2019-05-20 DIAGNOSIS — Y929 Unspecified place or not applicable: Secondary | ICD-10-CM | POA: Diagnosis not present

## 2019-05-20 DIAGNOSIS — W19XXXA Unspecified fall, initial encounter: Secondary | ICD-10-CM

## 2019-05-20 MED ORDER — MELOXICAM 15 MG PO TABS: 15.0000 mg | ORAL_TABLET | Freq: Every day | ORAL | 0 refills | Status: DC

## 2019-05-20 MED ORDER — HYDROCODONE-ACETAMINOPHEN 5-325 MG PO TABS
1.0000 | ORAL_TABLET | Freq: Once | ORAL | Status: AC
  Administered 2019-05-20: 21:00:00 1 via ORAL
  Filled 2019-05-20: qty 1

## 2019-05-20 NOTE — ED Notes (Signed)
E-signature not working at this time. Pt verbalized understanding of D/C instructions, follow up care and prescriptions. No further questions at this time. Pt in NAD at time of d/c

## 2019-05-20 NOTE — ED Triage Notes (Signed)
Patient ambulatory to triage with steady gait, without difficulty or distress noted; pt reports falling today injuring rt elbow this afternoon; swelling noted; st prev fx in same

## 2019-05-20 NOTE — ED Provider Notes (Signed)
[facility] Emergency Department Provider Note  ____________________________________________  Time seen: Approximately 7:39 PM  I have reviewed the triage vital signs and the nursing notes.   HISTORY  Chief Complaint Elbow Injury    HPI Richard Davis is a 43 y.o. male who presents the emergency department for evaluation of right elbow injury.  Patient reports that he tripped and fell landing directly on his elbow today.  He denies any other injury or complaint from this fall.  Patient is mostly concerned as he had a nondisplaced radial head fracture  after a fall at the end of August.  Patient was originally assessed at [facility], and then followed up with [facility].  Patient reports that he was able to treat using a sling and he has since discontinued sling use.  Patient did not hit his head or lose consciousness.  He denies any headache, visual changes, chest pain, shortness of breath, abdominal pain, nausea or vomiting.  Patient complains of sharp elbow pain as well as swelling to the elbow.  Patient is on chronic methadone.  According to previous documentation, patient freely admits to use of illicit substances to include cocaine and nonprescribed Xanax.        Past Medical History:  Diagnosis Date  . Arthritis    hands, foot, arm  . COPD (chronic obstructive pulmonary disease) (HCC)   . Depression   . GERD (gastroesophageal reflux disease)   . Hypercholesteremia   . Hypertension     Patient Active Problem List   Diagnosis Date Noted  . Periprosthetic fracture around internal prosthetic hip joint 09/09/2018  . Avascular necrosis (HCC) 12/07/2016  . Leg pain 10/23/2016  . Left hip pain 10/05/2016  . Hypogonadism in male 03/27/2016  . Surgical complication involving left eye associated with non-ophthalmic procedure 06/17/2015  . Hx of cardiac catheterization 02/03/2015  . 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  Chest pain with high risk for cardiac etiology 01/27/2015  . Abnormal nuclear stress test 01/27/2015  . Chronic pain 01/27/2015  . GERD (gastroesophageal reflux disease) 01/27/2015  . Hyperlipidemia 01/27/2015  . Hypertension 01/27/2015  . Joint pain 01/27/2015  . Sinusitis 01/27/2015  . Tobacco use 01/27/2015  . SOB (shortness of breath) on exertion 12/31/2014  . Gynecomastia 02/10/2013  . Schizoaffective disorder (HCC) 06/04/2012    Past Surgical History:  Procedure Laterality Date  . CARDIAC CATHETERIZATION N/A 01/27/2015   Procedure: Left Heart Cath;  Surgeon: Marcina Millard, MD;  Location: [facility] INVASIVE CV LAB;  Service: Cardiovascular;  Laterality: N/A;  . ETHMOIDECTOMY Left 06/17/2015   Procedure: ETHMOIDECTOMY REVISION and removal of polyps.;  Surgeon: Vernie Murders, MD;  Location: [facility];  Service: ENT;  Laterality: Left;  . FRONTAL SINUS EXPLORATION Left 06/17/2015   Procedure: FRONTAL Sinusotomy with removal of polyps.;  Surgeon: Vernie Murders, MD;  Location: [facility];  Service: ENT;  Laterality: Left;  . IMAGE GUIDED SINUS SURGERY N/A 06/17/2015   Procedure: IMAGE GUIDED SINUS SURGERY;  Surgeon: Vernie Murders, MD;  Location: [facility];  Service: ENT;  Laterality: N/A;  GAVE DISK TO [name] PROPEL Gave 2nd disk to cece 10-24 kp  . JOINT REPLACEMENT    . MAXILLARY ANTROSTOMY Left 06/17/2015   Procedure: ENDOSCOPIC MAXILLARY ANTROSTOMY WITH REMOVAL CONTENTS;  Surgeon: Vernie Murders, MD;  Location: [facility];  Service: ENT;  Laterality: Left;  . ORIF PERIPROSTHETIC FRACTURE Left 09/12/2018   Procedure: OPEN REDUCTION INTERNAL FIXATION (ORIF) PERIPROSTHETIC FRACTURE;  Surgeon: Kennedy Bucker, MD;  Location: [facility];  Service: Orthopedics;  Laterality: Left;  . POLYPECTOMY Left 06/17/2015   Procedure: POLYPECTOMY NASAL;  Surgeon: Vernie Murders, MD;  Location: [facility];  Service: ENT;  Laterality: Left;  . TONSILLECTOMY       Prior to Admission medications   Medication Sig Start Date End Date Taking? Authorizing Provider  ADVAIR [facility] 115-21 MCG/ACT inhaler USE 2 INHALATIONS BID 03/30/17   [provider]  albuterol (PROVENTIL HFA;VENTOLIN HFA) 108 (90 BASE) MCG/ACT inhaler Inhale 2 puffs into the lungs every 6 (six) hours as needed for wheezing or shortness of breath.    [provider]  amitriptyline (ELAVIL) 100 MG tablet Take 50-100 mg by mouth at bedtime.     [provider]  amoxicillin-clavulanate (AUGMENTIN) 875-125 MG tablet Take 1 tablet by mouth every 12 (twelve) hours. Patient not taking: Reported on 09/09/2018 04/14/18   Richard Feil, PA-C  B-COMPLEX-C PO Take 1 tablet by mouth daily.    [provider]  enoxaparin (LOVENOX) 40 MG/0.4ML injection Inject 0.4 mLs (40 mg total) into the skin every 12 (twelve) hours. 09/15/18   Richard Oregon, PA-C  gabapentin (NEURONTIN) 300 MG capsule Take 300 mg by mouth 3 (three) times daily.    [provider]  haloperidol (HALDOL) 10 MG tablet Take 5-10 mg by mouth 3 (three) times daily. Take one tablet by mouth every morning, and one-half tablet every afternoon and one tablet every night at bedtime    [provider]  HYDROcodone-acetaminophen (NORCO/VICODIN) 5-325 MG tablet Take 1 tablet by mouth every 4 (four) hours as needed. 09/18/18   Minna Antis, MD  lisinopril-hydrochlorothiazide (PRINZIDE,ZESTORETIC) 20-12.5 MG tablet Take 1 tablet by mouth daily.    [provider]  meloxicam (MOBIC) 15 MG tablet Take 1 tablet (15 mg total) by mouth daily. 05/20/19   [facility], Richard Royals, PA-C  METHADONE HCL PO Take 146 mg by mouth daily.    [provider]  Misc. Devices [facility]) MISC Please provide 1 wheelchair for the patient. 09/18/18   Minna Antis, MD  QUEtiapine (SEROQUEL XR) 400 MG 24 hr tablet 400 mg 2 (two) times daily.  03/13/17   [provider]  simvastatin (ZOCOR)  40 MG tablet Take 40 mg by mouth daily.    [provider]  sodium chloride (OCEAN) 0.65 % SOLN nasal spray Place 2 sprays into both nostrils every 2 (two) hours while awake. 01/18/18 02/17/18  Betancourt, Jarold Song, NP  traZODone (DESYREL) 100 MG tablet Take 300-400 mg by mouth at bedtime.     [provider]    Allergies Patient has no known allergies.  Family History  Problem Relation Age of Onset  . Hypercholesterolemia Mother   . Crohn's disease Father     Social History Social History   Tobacco Use  . Smoking status: Current Every Day Smoker    Packs/day: 2.00    Years: 15.00    Pack years: 30.00    Types: Cigarettes  . Smokeless tobacco: Never Used  Substance Use Topics  . Alcohol use: No  . Drug use: No     Review of Systems  Constitutional: No fever/chills Eyes: No visual changes. No discharge ENT: No upper respiratory complaints. Cardiovascular: no chest pain. Respiratory: no cough. No SOB. Gastrointestinal: No abdominal pain.  No nausea, no vomiting.  Musculoskeletal: Positive for right elbow pain/injury Skin: Negative for rash, abrasions, lacerations, ecchymosis. Neurological: Negative for headaches, focal weakness or  numbness. 10-point ROS otherwise negative.  ____________________________________________   PHYSICAL EXAM:  VITAL SIGNS: ED Triage Vitals  Enc Vitals Group     BP 05/20/19 1925 (!) 158/90     Pulse Rate 05/20/19 1925 (!) 111     Resp 05/20/19 1925 (!) 22     Temp 05/20/19 1925 97.8 F (36.6 C)     Temp Source 05/20/19 1925 Oral     SpO2 05/20/19 1925 95 %     Weight 05/20/19 1925 280 lb (127 kg)     Height 05/20/19 1925 5\' 11"  (1.803 m)     Head Circumference --      Peak Flow --      Pain Score 05/20/19 1930 10     Pain Loc --      Pain Edu? --      Excl. in [place]? --      Constitutional: Alert and oriented. Well appearing and in no acute distress. Eyes: Conjunctivae are normal. PERRL. EOMI. Head:  Atraumatic. ENT:      Ears:       Nose: No congestion/rhinnorhea.      Mouth/Throat: Mucous membranes are moist.  Neck: No stridor.    Cardiovascular: Normal rate, regular rhythm. Normal S1 and S2.  Good peripheral circulation. Respiratory: Normal respiratory effort without tachypnea or retractions. Lungs CTAB. Good air entry to the bases with no decreased or absent breath sounds. Musculoskeletal: Full range of motion to all extremities. No gross deformities appreciated.  Visualization of right elbow reveals gross edema over the posterior aspect of the elbow.  Patient is actively extending and flexing the elbow on my arrival into the room.  Patient does have some ecchymosis in the lateral aspect of the elbow as well.  Patient is tender from the distal humerus into the proximal radius region.  He is also tender to palpation of the olecranon process.  Again, patient is moving elbow appropriately at this time.  Tenderness is primarily in the posterior aspect, over the radial head.  Palpation along the mid and distal forearm as well as the mid and proximal humerus is unremarkable.  No other visible signs of trauma to the right upper extremity.  Radial pulse intact.  Sensation intact all digits. Neurologic:  Normal speech and language. No gross focal neurologic deficits are appreciated.  Skin:  Skin is warm, dry and intact. No rash noted. Psychiatric: Mood and affect are normal. Speech and behavior are normal. Patient exhibits appropriate insight and judgement.   ____________________________________________   LABS (all labs ordered are listed, but only abnormal results are displayed)  Labs Reviewed - No data to display ____________________________________________  EKG   ____________________________________________  RADIOLOGY I personally viewed and evaluated these images as part of my medical decision making, as well as reviewing the written report by the radiologist.  Dg Elbow Complete  Right  Result Date: 05/20/2019 CLINICAL DATA:  Pain following fall EXAM: RIGHT ELBOW - COMPLETE 3+ VIEW COMPARISON:  None. FINDINGS: Frontal, lateral, and bilateral oblique views were obtained. There is soft tissue swelling posteriorly. There is no demonstrable fracture or dislocation. No appreciable joint effusion. Joint spaces appear unremarkable. No erosive change. IMPRESSION: Soft tissue swelling. No demonstrable fracture or dislocation. No appreciable arthropathy. Electronically Signed   By: Bretta BangWilliam  Woodruff III M.D.   On: 05/20/2019 20:13    ____________________________________________    PROCEDURES  Procedure(s) performed:    Procedures    Medications  HYDROcodone-acetaminophen (NORCO/VICODIN) 5-325 MG per tablet 1 tablet (has no  administration in time range)     ____________________________________________   INITIAL IMPRESSION / ASSESSMENT AND PLAN / ED COURSE  Pertinent labs & imaging results that were available during my care of the patient were reviewed by me and considered in my medical decision making (see chart for details).  Review of the [place] CSRS was performed in accordance of the [place] prior to dispensing any controlled drugs.           Patient's diagnosis is consistent with elbow contusion after a fall.  Patient sustained a mechanical fall landing on his elbow.  Patient did have a radial head fracture earlier this year that healed up without surgical intervention.  Patient landed on the elbow after tripping today.  No other complaints.  Patient did have edema and ecchymosis about the posterior aspect of the elbow.  X-ray reveals no acute osseous abnormality.  No further work-up deemed necessary at this time.  Meloxicam for symptom relief at home.  Follow-up with orthopedics or primary care as needed. Patient is given ED precautions to return to the ED for any worsening or new symptoms.     ____________________________________________  FINAL CLINICAL  IMPRESSION(S) / ED DIAGNOSES  Final diagnoses:  Contusion of right elbow, initial encounter  Fall, initial encounter      NEW MEDICATIONS STARTED DURING THIS VISIT:  ED Discharge Orders         Ordered    meloxicam (MOBIC) 15 MG tablet  Daily     05/20/19 2029              This chart was dictated using voice recognition software/Dragon. Despite best efforts to proofread, errors can occur which can change the meaning. Any change was purely unintentional.    Darletta Moll, PA-C 05/20/19 2031    Merlyn Lot, MD 05/20/19 2059

## 2020-01-10 IMAGING — DX DG HIP (WITH OR WITHOUT PELVIS) 2-3V*L*
3 series · 3 of 3 positions shown · non-contrast
Comparison: 09/09/2018

CLINICAL DATA: S/p ORIF left hip.

EXAM:
DG HIP (WITH OR WITHOUT PELVIS) 2-3V LEFT

[pelvis ap]
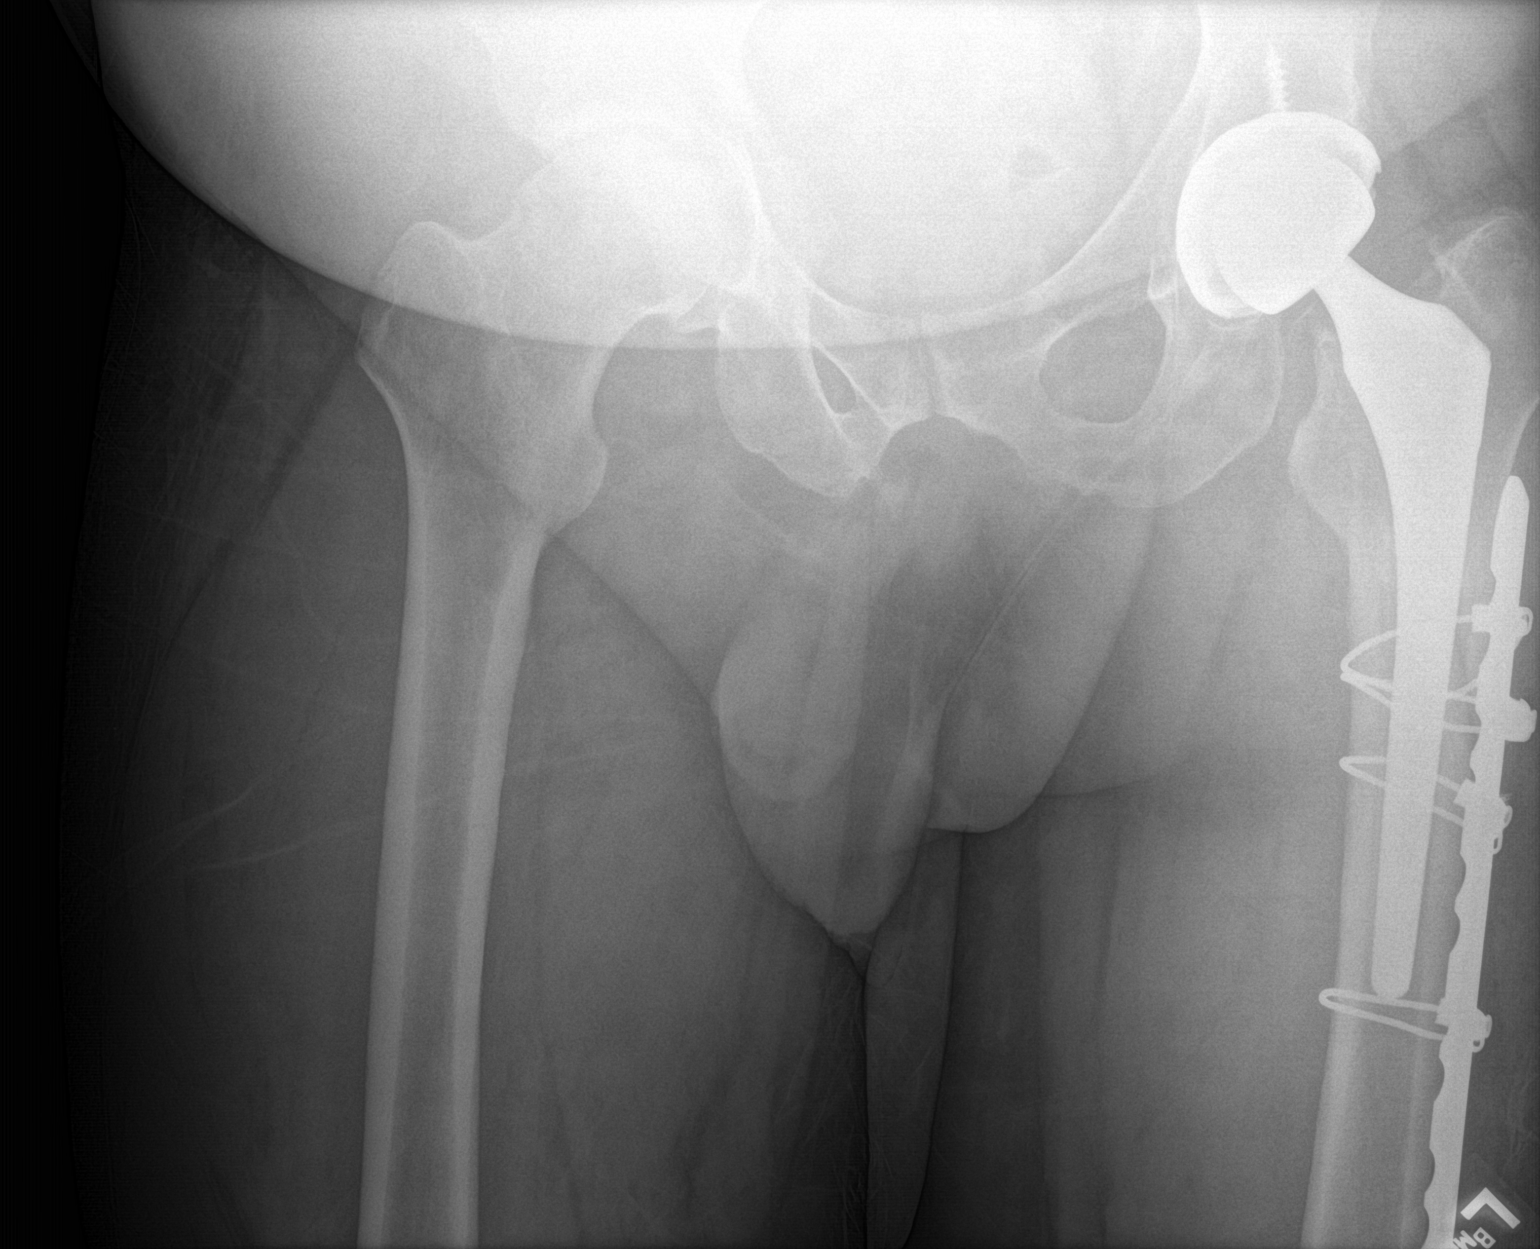

[hip ap]
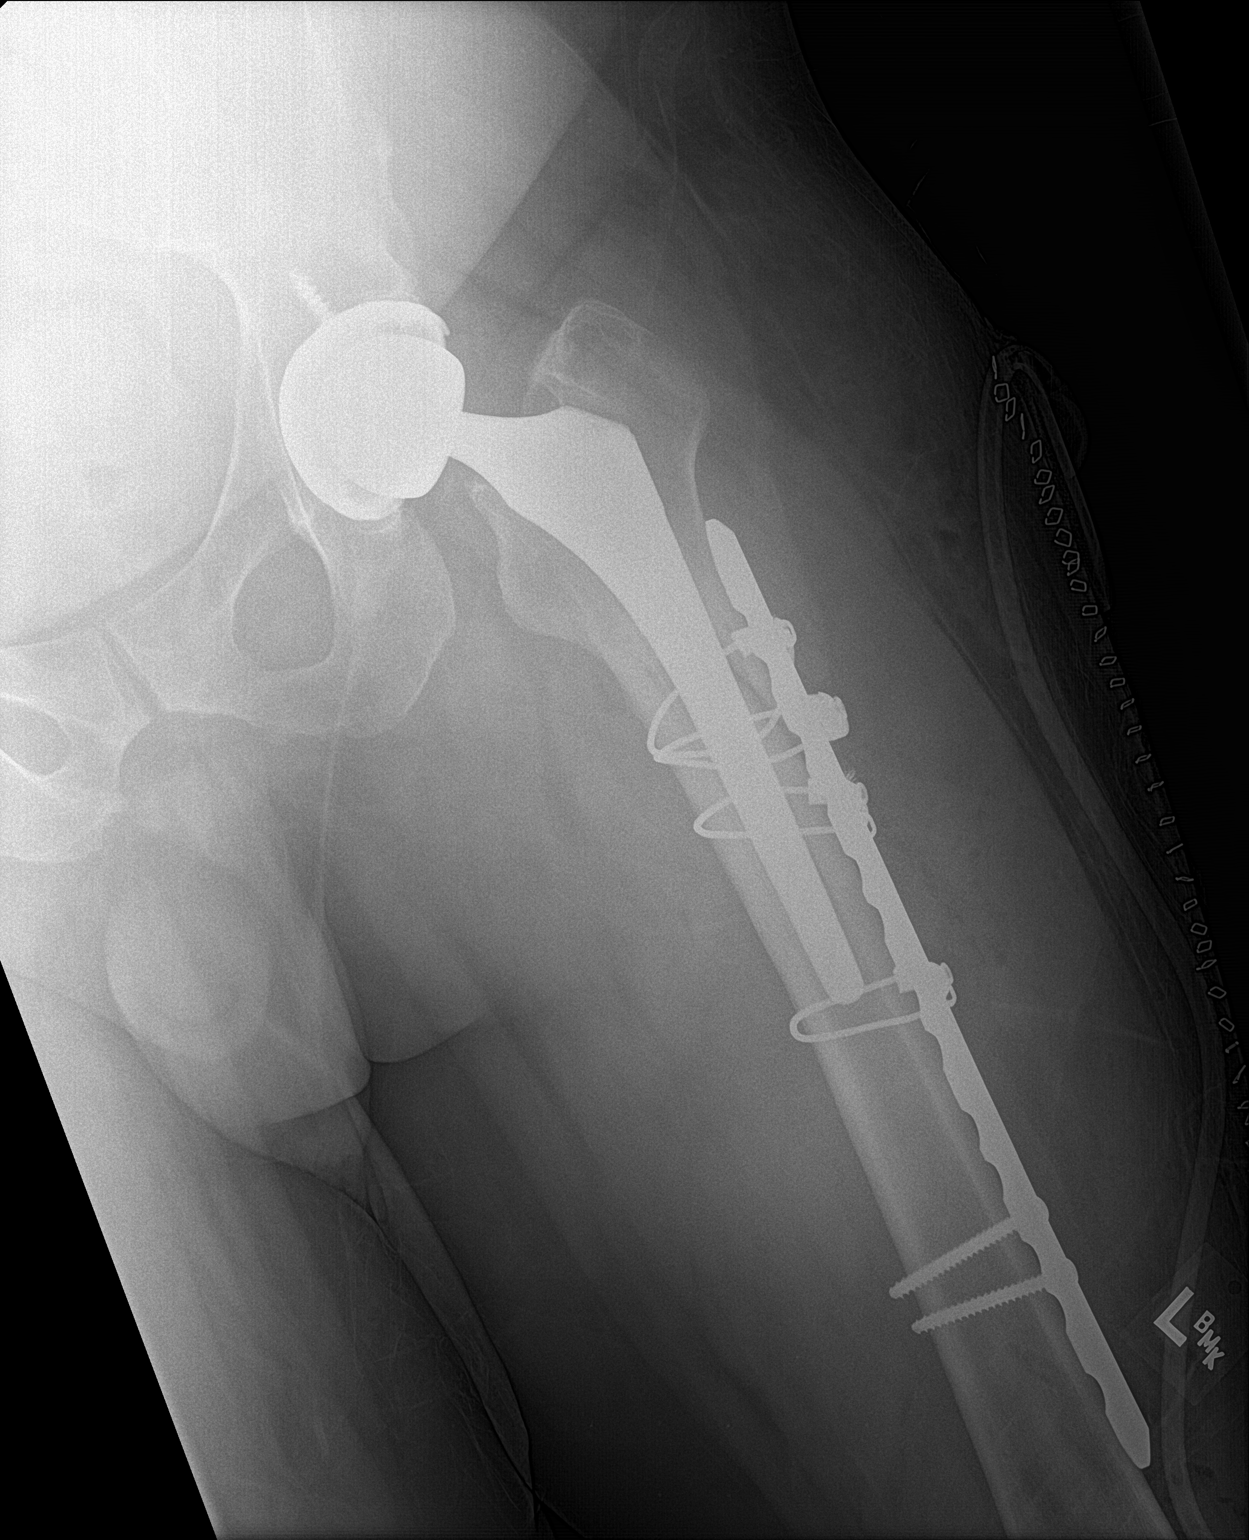

[hip lat]
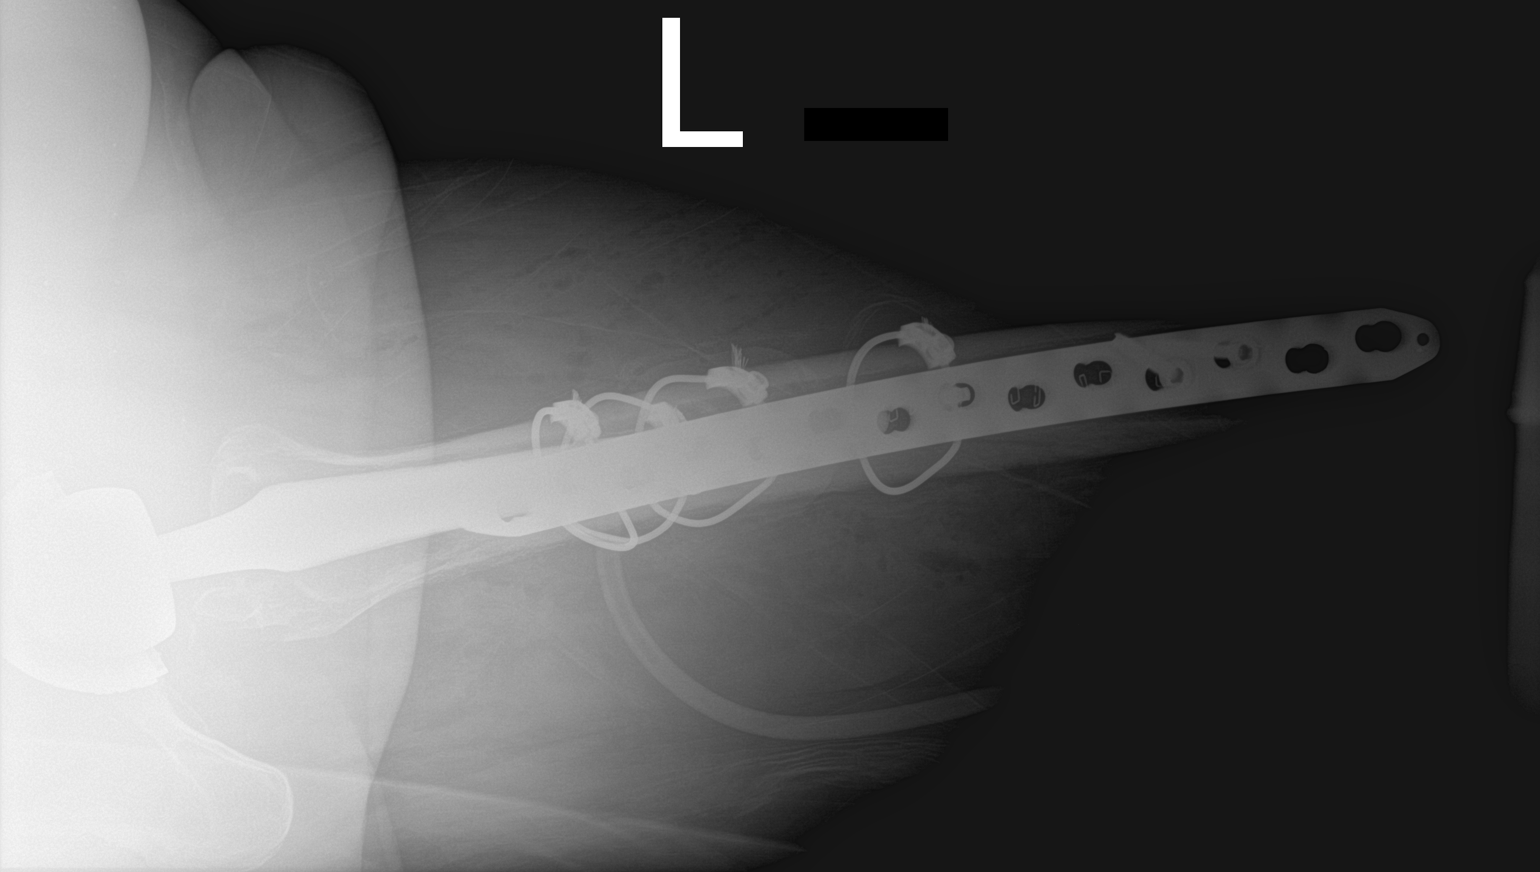

[3 of 3 positions shown; findings below may reference images not displayed]

FINDINGS: Postoperative changes with left total hip arthroplasty. Additional
plate and screw and cerclage fixation of the lateral femoral shaft.
No dislocation of the prosthesis. Components appear well seated.
Skin clips and soft tissue gas consistent with recent surgery.
IMPRESSION: Postoperative left total hip arthroplasty and plate and screw
fixation of the femoral shaft.. Components appear well seated.

## 2020-05-07 ENCOUNTER — Other Ambulatory Visit: Payer: Self-pay | Admitting: Orthopedic Surgery

## 2020-05-07 DIAGNOSIS — M87022 Idiopathic aseptic necrosis of left humerus: Secondary | ICD-10-CM

## 2020-05-07 DIAGNOSIS — M25512 Pain in left shoulder: Secondary | ICD-10-CM

## 2020-05-13 ENCOUNTER — Telehealth: Payer: Self-pay | Admitting: Radiology

## 2020-05-29 ENCOUNTER — Ambulatory Visit
Admission: RE | Admit: 2020-05-29 | Discharge: 2020-05-29 | Disposition: A | Discharge status: Home / Self Care | Payer: Medicare Other | Source: Ambulatory Visit | Attending: Orthopedic Surgery | Admitting: Orthopedic Surgery

## 2020-05-29 ENCOUNTER — Other Ambulatory Visit: Payer: Self-pay

## 2020-05-29 DIAGNOSIS — M87022 Idiopathic aseptic necrosis of left humerus: Secondary | ICD-10-CM | POA: Insufficient documentation

## 2020-05-29 DIAGNOSIS — M25512 Pain in left shoulder: Secondary | ICD-10-CM | POA: Insufficient documentation

## 2020-05-29 DIAGNOSIS — G8929 Other chronic pain: Secondary | ICD-10-CM | POA: Insufficient documentation

## 2022-06-09 ENCOUNTER — Emergency Department
Admission: EM | Admit: 2022-06-09 | Discharge: 2022-06-09 | Discharge status: Against Medical Advice | Payer: Medicare Other | Attending: Emergency Medicine | Admitting: Emergency Medicine

## 2022-06-09 ENCOUNTER — Other Ambulatory Visit: Payer: Self-pay

## 2022-06-09 ENCOUNTER — Emergency Department: Payer: Medicare Other

## 2022-06-09 DIAGNOSIS — R002 Palpitations: Secondary | ICD-10-CM | POA: Insufficient documentation

## 2022-06-09 DIAGNOSIS — M25511 Pain in right shoulder: Secondary | ICD-10-CM | POA: Insufficient documentation

## 2022-06-09 DIAGNOSIS — Z5321 Procedure and treatment not carried out due to patient leaving prior to being seen by health care provider: Secondary | ICD-10-CM | POA: Insufficient documentation

## 2022-06-09 LAB — BASIC METABOLIC PANEL
Anion gap: 10 (ref 5–15)
BUN: 5 mg/dL — ABNORMAL LOW (ref 6–20)
CO2: 29 mmol/L (ref 22–32)
Calcium: 9.8 mg/dL (ref 8.9–10.3)
Chloride: 98 mmol/L (ref 98–111)
Creatinine, Ser: 0.93 mg/dL (ref 0.61–1.24)
GFR, Estimated: >60 mL/min (ref >60)
Glucose, Bld: 131 mg/dL — ABNORMAL HIGH (ref 70–99)
Potassium: 3.6 mmol/L (ref 3.5–5.1)
Sodium: 137 mmol/L (ref 135–145)

## 2022-06-09 LAB — CBC
HCT: 43.7 % (ref 39.0–52.0)
Hemoglobin: 15.3 g/dL (ref 13.0–17.0)
MCH: 30.5 pg (ref 26.0–34.0)
MCHC: 35 g/dL (ref 30.0–36.0)
MCV: 87.2 fL (ref 80.0–100.0)
Platelets: 403 10*3/uL — ABNORMAL HIGH (ref 150–400)
RBC: 5.01 MIL/uL (ref 4.22–5.81)
RDW: 13.2 % (ref 11.5–15.5)
WBC: 14.2 10*3/uL — ABNORMAL HIGH (ref 4.0–10.5)
nRBC: 0 % (ref 0.0–0.2)

## 2022-06-09 LAB — TROPONIN I (HIGH SENSITIVITY): Troponin I (High Sensitivity): 4 ng/L (ref <18)

## 2022-06-09 NOTE — ED Notes (Addendum)
First Nurse Note: Pt to ED via [facility] from home for palpitations. Per EMS pt was pacing around the yard when they arrived. Pt admits that he used 1 gram of crack today per EMS. Pt has hx/o CVA, possible MI, and substance abuse. Pt arrives in NAD.
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
## 2022-06-09 NOTE — ED Notes (Signed)
Patient called x3 with no answer. 

## 2022-06-09 NOTE — ED Triage Notes (Signed)
Pt arrives with c/o palpiations that started a few hours ago. Per pt, he did use cocaine before this episode. Per pt, this has happen before with cocaine use. Pt endorses SOB. Pt denies CP.

## 2022-06-09 NOTE — ED Notes (Signed)
Pt called for treatment room. No answer. Pt not visualized in the lobby

## 2022-08-18 ENCOUNTER — Other Ambulatory Visit: Payer: Self-pay

## 2022-08-18 ENCOUNTER — Emergency Department
Admission: EM | Admit: 2022-08-18 | Discharge: 2022-08-18 | Disposition: A | Discharge status: Home / Self Care | Payer: Medicare Other | Attending: Emergency Medicine | Admitting: Emergency Medicine

## 2022-08-18 DIAGNOSIS — Z20822 Contact with and (suspected) exposure to covid-19: Secondary | ICD-10-CM | POA: Insufficient documentation

## 2022-08-18 DIAGNOSIS — B86 Scabies: Secondary | ICD-10-CM | POA: Diagnosis not present

## 2022-08-18 DIAGNOSIS — R21 Rash and other nonspecific skin eruption: Secondary | ICD-10-CM | POA: Diagnosis present

## 2022-08-18 LAB — CBC WITH DIFFERENTIAL/PLATELET
Abs Immature Granulocytes: 0.03 10*3/uL (ref 0.00–0.07)
Basophils Absolute: 0 10*3/uL (ref 0.0–0.1)
Basophils Relative: 1 %
Eosinophils Absolute: 0.1 10*3/uL (ref 0.0–0.5)
Eosinophils Relative: 1 %
HCT: 44.7 % (ref 39.0–52.0)
Hemoglobin: 15.3 g/dL (ref 13.0–17.0)
Immature Granulocytes: 0 %
Lymphocytes Relative: 22 %
Lymphs Abs: 1.8 10*3/uL (ref 0.7–4.0)
MCH: 30 pg (ref 26.0–34.0)
MCHC: 34.2 g/dL (ref 30.0–36.0)
MCV: 87.6 fL (ref 80.0–100.0)
Monocytes Absolute: 0.6 10*3/uL (ref 0.1–1.0)
Monocytes Relative: 7 %
Neutro Abs: 5.8 10*3/uL (ref 1.7–7.7)
Neutrophils Relative %: 69 %
Platelets: 267 10*3/uL (ref 150–400)
RBC: 5.1 MIL/uL (ref 4.22–5.81)
RDW: 12.5 % (ref 11.5–15.5)
WBC: 8.3 10*3/uL (ref 4.0–10.5)
nRBC: 0 % (ref 0.0–0.2)

## 2022-08-18 LAB — RESP PANEL BY RT-PCR (RSV, FLU A&B, COVID)  RVPGX2
Influenza A by PCR: NEGATIVE
Influenza B by PCR: NEGATIVE
Resp Syncytial Virus by PCR: NEGATIVE
SARS Coronavirus 2 by RT PCR: NEGATIVE

## 2022-08-18 LAB — COMPREHENSIVE METABOLIC PANEL
ALT: 15 U/L (ref 0–44)
AST: 20 U/L (ref 15–41)
Albumin: 4.1 g/dL (ref 3.5–5.0)
Alkaline Phosphatase: 90 U/L (ref 38–126)
Anion gap: 9 (ref 5–15)
BUN: 5 mg/dL — ABNORMAL LOW (ref 6–20)
CO2: 23 mmol/L (ref 22–32)
Calcium: 9 mg/dL (ref 8.9–10.3)
Chloride: 99 mmol/L (ref 98–111)
Creatinine, Ser: 0.81 mg/dL (ref 0.61–1.24)
GFR, Estimated: >60 mL/min (ref >60)
Glucose, Bld: 100 mg/dL — ABNORMAL HIGH (ref 70–99)
Potassium: 3.2 mmol/L — ABNORMAL LOW (ref 3.5–5.1)
Sodium: 131 mmol/L — ABNORMAL LOW (ref 135–145)
Total Bilirubin: 0.6 mg/dL (ref 0.3–1.2)
Total Protein: 7.1 g/dL (ref 6.5–8.1)

## 2022-08-18 LAB — GROUP A STREP BY PCR: Group A Strep by PCR: NOT DETECTED

## 2022-08-18 MED ORDER — IVERMECTIN 3 MG PO TABS: 200.0000 ug/kg | ORAL_TABLET | Freq: Once | ORAL | 1 refills | Status: AC

## 2022-08-18 MED ORDER — POTASSIUM CHLORIDE CRYS ER 20 MEQ PO TBCR
40.0000 meq | EXTENDED_RELEASE_TABLET | Freq: Once | ORAL | Status: AC
  Administered 2022-08-18: 40 meq via ORAL
  Filled 2022-08-18: qty 2

## 2022-08-18 NOTE — Discharge Instructions (Signed)
Take 7 tablets of ivermectin.  Wait 2 weeks and repeat dosing if still symptomatic.

## 2022-08-18 NOTE — ED Triage Notes (Addendum)
Pt c/o rash on wrists and lower legs that started last night. Pt denies bed bugs, sleeps w/ cats. Pt also reports dizziness when bending over, tiredness, and sore throat. Pt denies CP, dizziness, N/V/D. Pt AOX4, NAD noted.

## 2022-08-18 NOTE — ED Provider Notes (Signed)
[facility] Provider Note  Patient Contact: 4:50 PM (approximate)   History   Rash   HPI  Richard Davis is a 47 y.o. male presents to the emergency department with pruritic rash of the hands and feet.  Patient's rash is erythematous with small burrows.  Patient denies sleeping in new environment/bedding.  No sick contacts in the home with similar symptoms.  No chest pain, chest tightness or shortness of breath.  No recent exposure to outdoor foliage.  No tick bites.      Physical Exam   Triage Vital Signs: ED Triage Vitals  Enc Vitals Group     BP 08/18/22 1410 122/75     Pulse Rate 08/18/22 1410 (!) 104     Resp 08/18/22 1410 17     Temp 08/18/22 1410 98.1 F (36.7 C)     Temp Source 08/18/22 1410 Oral     SpO2 08/18/22 1410 97 %     Weight 08/18/22 1534 211 lb 9.6 oz (96 kg)     Height --      Head Circumference --      Peak Flow --      Pain Score 08/18/22 1411 4     Pain Loc --      Pain Edu? --      Excl. in [place]? --     Most recent vital signs: Vitals:   08/18/22 1410  BP: 122/75  Pulse: (!) 104  Resp: 17  Temp: 98.1 F (36.7 C)  SpO2: 97%     General: Alert and in no acute distress. Eyes:  PERRL. EOMI. Head: No acute traumatic findings ENT:      Nose: No congestion/rhinnorhea.      Mouth/Throat: Mucous membranes are moist. Neck: No stridor. No cervical spine tenderness to palpation.  Cardiovascular:  Good peripheral perfusion Respiratory: Normal respiratory effort without tachypnea or retractions. Lungs CTAB. Good air entry to the bases with no decreased or absent breath sounds. Gastrointestinal: Bowel sounds 4 quadrants. Soft and nontender to palpation. No guarding or rigidity. No palpable masses. No distention. No CVA tenderness. Musculoskeletal: Full range of motion to all extremities.  Neurologic:  No gross focal neurologic deficits are appreciated.  Skin: Patient has dry, scaling rash with burrows on hands and  feet.   ED Results / Procedures / Treatments   Labs (all labs ordered are listed, but only abnormal results are displayed) Labs Reviewed  COMPREHENSIVE METABOLIC PANEL - Abnormal; Notable for the following components:      Result Value   Sodium 131 (*)    Potassium 3.2 (*)    Glucose, Bld 100 (*)    BUN 5 (*)    All other components within normal limits  RESP PANEL BY RT-PCR (RSV, FLU A&B, COVID)  [id number]  GROUP A STREP BY PCR  CBC WITH DIFFERENTIAL/PLATELET        PROCEDURES:  Critical Care performed: No  Procedures   MEDICATIONS ORDERED IN ED: Medications  potassium chloride SA (KLOR-CON M) CR tablet 40 mEq (40 mEq Oral Given 08/18/22 1546)     IMPRESSION / MDM / ASSESSMENT AND PLAN / ED COURSE  I reviewed the triage vital signs and the nursing notes.                              Assessment and plan Scabies 47 year old male presents to the emergency department with rash that most consistently  resembles scabies.  Given distribution on hands, feet and legs, will treat with oral ivermectin.  Return precautions were given to return with new or worsening symptoms.  All patient questions were answered.     FINAL CLINICAL IMPRESSION(S) / ED DIAGNOSES   Final diagnoses:  Scabies     Rx / DC Orders   ED Discharge Orders          Ordered    ivermectin (STROMECTOL) 3 MG TABS tablet   Once        08/18/22 1536             Note:  This document was prepared using Dragon voice recognition software and may include unintentional dictation errors.   Vallarie Mare Dalton, PA-C 08/18/22 1652    Harvest Dark, MD 08/18/22 Kathyrn Drown

## 2022-08-30 ENCOUNTER — Emergency Department: Payer: Medicare Other

## 2022-08-30 ENCOUNTER — Emergency Department
Admission: EM | Admit: 2022-08-30 | Discharge: 2022-08-30 | Disposition: A | Discharge status: Home / Self Care | Payer: Medicare Other | Attending: Emergency Medicine | Admitting: Emergency Medicine

## 2022-08-30 ENCOUNTER — Encounter: Payer: Self-pay | Admitting: *Deleted

## 2022-08-30 ENCOUNTER — Other Ambulatory Visit: Payer: Self-pay

## 2022-08-30 DIAGNOSIS — R079 Chest pain, unspecified: Secondary | ICD-10-CM | POA: Diagnosis not present

## 2022-08-30 DIAGNOSIS — F1410 Cocaine abuse, uncomplicated: Secondary | ICD-10-CM | POA: Insufficient documentation

## 2022-08-30 DIAGNOSIS — F1910 Other psychoactive substance abuse, uncomplicated: Secondary | ICD-10-CM

## 2022-08-30 LAB — BASIC METABOLIC PANEL
Anion gap: 10 (ref 5–15)
BUN: 7 mg/dL (ref 6–20)
CO2: 23 mmol/L (ref 22–32)
Calcium: 9.1 mg/dL (ref 8.9–10.3)
Chloride: 96 mmol/L — ABNORMAL LOW (ref 98–111)
Creatinine, Ser: 0.98 mg/dL (ref 0.61–1.24)
GFR, Estimated: >60 mL/min (ref >60)
Glucose, Bld: 99 mg/dL (ref 70–99)
Potassium: 3.4 mmol/L — ABNORMAL LOW (ref 3.5–5.1)
Sodium: 129 mmol/L — ABNORMAL LOW (ref 135–145)

## 2022-08-30 LAB — CBC
HCT: 42.8 % (ref 39.0–52.0)
Hemoglobin: 14.8 g/dL (ref 13.0–17.0)
MCH: 29.7 pg (ref 26.0–34.0)
MCHC: 34.6 g/dL (ref 30.0–36.0)
MCV: 85.9 fL (ref 80.0–100.0)
Platelets: 276 10*3/uL (ref 150–400)
RBC: 4.98 MIL/uL (ref 4.22–5.81)
RDW: 12.4 % (ref 11.5–15.5)
WBC: 10.6 10*3/uL — ABNORMAL HIGH (ref 4.0–10.5)
nRBC: 0 % (ref 0.0–0.2)

## 2022-08-30 LAB — TROPONIN I (HIGH SENSITIVITY): Troponin I (High Sensitivity): 3 ng/L (ref <18)

## 2022-08-30 NOTE — ED Provider Notes (Signed)
[facility] Provider Note    Event Date/Time   First MD Initiated Contact with Patient 08/30/22 2132     (approximate)   History   Chest Pain   HPI  Richard Davis is a 47 y.o. male who admits to using crack cocaine today, he reports he developed chest tightness after using.  He is feeling improved now, asymptomatic.  No shortness of breath.  No headache or neurodeficits     Physical Exam   Triage Vital Signs: ED Triage Vitals  Enc Vitals Group     BP 08/30/22 2112 (!) 146/81     Pulse Rate 08/30/22 2112 93     Resp 08/30/22 2112 20     Temp 08/30/22 2112 97.6 F (36.4 C)     Temp Source 08/30/22 2112 Oral     SpO2 08/30/22 2112 100 %     Weight 08/30/22 2113 95.3 kg (210 lb)     Height 08/30/22 2113 1.803 m (5\' 11" )     Head Circumference --      Peak Flow --      Pain Score 08/30/22 2113 8     Pain Loc --      Pain Edu? --      Excl. in [place]? --     Most recent vital signs: Vitals:   08/30/22 2112 08/30/22 2235  BP: (!) 146/81 137/82  Pulse: 93 89  Resp: 20 17  Temp: 97.6 F (36.4 C)   SpO2: 100% 99%     General: Awake, no distress.  CV:  Good peripheral perfusion.  Regular rate and rhythm Resp:  Normal effort.  Clear to auscultation bilaterally Abd:  No distention.  Other:  No calf pain or swelling   ED Results / Procedures / Treatments   Labs (all labs ordered are listed, but only abnormal results are displayed) Labs Reviewed  BASIC METABOLIC PANEL - Abnormal; Notable for the following components:      Result Value   Sodium 129 (*)    Potassium 3.4 (*)    Chloride 96 (*)    All other components within normal limits  CBC - Abnormal; Notable for the following components:   WBC 10.6 (*)    All other components within normal limits  TROPONIN I (HIGH SENSITIVITY)  TROPONIN I (HIGH SENSITIVITY)     EKG  ED ECG REPORT I, Lavonia Drafts, the attending physician, personally viewed and interpreted this ECG.  Date:  08/30/2022  Rhythm: normal sinus rhythm QRS Axis: normal Intervals: normal ST/T Wave abnormalities: normal Narrative Interpretation: no evidence of acute ischemia    RADIOLOGY Chest x-ray viewed interpreted by me, no acute abnormality    PROCEDURES:  Critical Care performed:   Procedures   MEDICATIONS ORDERED IN ED: Medications - No data to display   IMPRESSION / MDM / [place] / ED COURSE  I reviewed the triage vital signs and the nursing notes. Patient's presentation is most consistent with acute presentation with potential threat to life or bodily function.  Patient presents with chest pain in the setting of cocaine use.  Differential includes ACS, less likely pneumonia or bronchospasm.    Lung exam is normal.  EKG is overall reassuring, high sensitive troponin is normal.  Chest x-ray without evidence of pneumonia or edema.  No indication for admission at this time, appropriate for discharge with outpatient follow-up        FINAL CLINICAL IMPRESSION(S) / ED DIAGNOSES   Final  diagnoses:  Substance abuse ([place])  Chest pain, unspecified type     Rx / DC Orders   ED Discharge Orders     None        Note:  This document was prepared using Dragon voice recognition software and may include unintentional dictation errors.   Lavonia Drafts, MD 08/30/22 [phone]

## 2022-08-30 NOTE — ED Triage Notes (Addendum)
Pt brought in via ems from home.  Pt smoked crack tonight and started having chest pain.  Pt reports burning in throat/mouth.  No resp distress.  Pt alert  speech clear  iv in place.

## 2022-12-11 ENCOUNTER — Ambulatory Visit
Admission: EM | Admit: 2022-12-11 | Discharge: 2022-12-11 | Disposition: A | Discharge status: Home / Self Care | Payer: Medicare Other | Attending: Family Medicine | Admitting: Family Medicine

## 2022-12-11 ENCOUNTER — Ambulatory Visit (INDEPENDENT_AMBULATORY_CARE_PROVIDER_SITE_OTHER): Payer: Medicare Other

## 2022-12-11 DIAGNOSIS — B86 Scabies: Secondary | ICD-10-CM | POA: Diagnosis not present

## 2022-12-11 DIAGNOSIS — M25571 Pain in right ankle and joints of right foot: Secondary | ICD-10-CM

## 2022-12-11 MED ORDER — DICLOFENAC SODIUM 75 MG PO TBEC: 75.0000 mg | DELAYED_RELEASE_TABLET | Freq: Two times a day (BID) | ORAL | 0 refills | Status: AC

## 2022-12-11 MED ORDER — PERMETHRIN 5 % EX CREA: TOPICAL_CREAM | CUTANEOUS | 1 refills | Status: AC

## 2022-12-11 MED ORDER — PREDNISONE 20 MG PO TABS: 40.0000 mg | ORAL_TABLET | Freq: Every day | ORAL | 0 refills | Status: AC

## 2022-12-11 NOTE — ED Provider Notes (Addendum)
[facility]    CSN: [id number] Arrival date & time: 12/11/22  1351      History   Chief Complaint No chief complaint on file.   HPI Richard Davis is a 47 y.o. male.   Right ankle pain and swelling.  Sudden onset.  Started 2 weeks ago.  Spontaneously started.  No triggers.  No injury.  Not associated with bruising or redness.  Aggravated by weightbearing.  No pain at rest.  Sharp pain.  Involving ankle joint.  No history of gout.  6 out of 10 pain.  No radiation.  Sharp pain.  Also has scabies.  Going on for last 7 8 months.  Was given oral antiparasitic.  It did not help.  Was given permethrin.  It did not help.  When out of permethrin.  Wants refill on permethrin.  The history is provided by the patient.    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 09/09/2018   Avascular necrosis (HCC) 12/07/2016   Leg pain 10/23/2016   Left hip pain 10/05/2016   Hypogonadism in male 03/27/2016   Surgical complication involving left eye associated with non-ophthalmic procedure 06/17/2015   Hx of cardiac catheterization 02/03/2015   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   Chest pain with high risk for cardiac etiology 01/27/2015   Abnormal nuclear stress test 01/27/2015   Chronic pain 01/27/2015   GERD (gastroesophageal reflux disease) 01/27/2015   Hyperlipidemia 01/27/2015   Hypertension 01/27/2015   Joint pain 01/27/2015   Sinusitis 01/27/2015   Tobacco use 01/27/2015   SOB (shortness of breath) on exertion 12/31/2014   Gynecomastia 02/10/2013   Schizoaffective disorder (HCC) 06/04/2012    Past Surgical History:  Procedure Laterality Date   CARDIAC  CATHETERIZATION N/A 01/27/2015   Procedure: Left Heart Cath;  Surgeon: Marcina Millard, MD;  Location: [facility] INVASIVE CV LAB;  Service: Cardiovascular;  Laterality: N/A;   ETHMOIDECTOMY Left 06/17/2015   Procedure: ETHMOIDECTOMY REVISION and removal of polyps.;  Surgeon: Vernie Murders, MD;  Location: [facility];  Service: ENT;  Laterality: Left;   FRONTAL SINUS EXPLORATION Left 06/17/2015   Procedure: FRONTAL Sinusotomy with removal of polyps.;  Surgeon: Vernie Murders, MD;  Location: [facility];  Service: ENT;  Laterality: Left;   IMAGE GUIDED SINUS SURGERY N/A 06/17/2015   Procedure: IMAGE GUIDED SINUS SURGERY;  Surgeon: Vernie Murders, MD;  Location: [facility];  Service: ENT;  Laterality: N/A;  GAVE DISK TO [name] PROPEL Gave 2nd disk to cece 10-24 kp   JOINT REPLACEMENT     MAXILLARY ANTROSTOMY Left 06/17/2015   Procedure: ENDOSCOPIC MAXILLARY ANTROSTOMY WITH REMOVAL CONTENTS;  Surgeon: Vernie Murders, MD;  Location: [facility];  Service: ENT;  Laterality: Left;   ORIF PERIPROSTHETIC FRACTURE Left 09/12/2018   Procedure: OPEN REDUCTION INTERNAL FIXATION (ORIF) PERIPROSTHETIC FRACTURE;  Surgeon: Kennedy Bucker, MD;  Location: [facility] ORS;  Service: Orthopedics;  Laterality: Left;   POLYPECTOMY Left 06/17/2015   Procedure: POLYPECTOMY NASAL;  Surgeon: Vernie Murders, MD;  Location: [facility];  Service: ENT;  Laterality: Left;   TONSILLECTOMY         Home Medications  Prior to Admission medications   Medication Sig Start Date End Date Taking? Authorizing Provider  ARIPiprazole (ABILIFY) 10 MG tablet Take by mouth. 11/21/22  Yes [provider]  clonazePAM (KLONOPIN) 0.5 MG tablet Take by mouth. 01/12/22  Yes [provider]  diclofenac (VOLTAREN) 75 MG EC tablet Take 1 tablet (75 mg total) by mouth 2 (two) times daily for 15 days. 12/11/22 12/26/22 Yes Lura Em, MD  furosemide (LASIX) 20 MG tablet Take by mouth. 11/02/20  Yes  [provider]  permethrin (ELIMITE) 5 % cream Apply to affected area once 12/11/22  Yes Lura Em, MD  predniSONE (DELTASONE) 20 MG tablet Take 2 tablets (40 mg total) by mouth daily with breakfast for 5 days. 12/11/22 12/16/22 Yes Lura Em, MD  ADVAIR [facility] 161-09 MCG/ACT inhaler USE 2 INHALATIONS BID 03/30/17   [provider]  albuterol (PROVENTIL HFA;VENTOLIN HFA) 108 (90 BASE) MCG/ACT inhaler Inhale 2 puffs into the lungs every 6 (six) hours as needed for wheezing or shortness of breath.    [provider]  amitriptyline (ELAVIL) 100 MG tablet Take 50-100 mg by mouth at bedtime.     [provider]  amoxicillin-clavulanate (AUGMENTIN) 875-125 MG tablet Take 1 tablet by mouth every 12 (twelve) hours. Patient not taking: Reported on 09/09/2018 04/14/18   Lutricia Feil, PA-C  atorvastatin (LIPITOR) 40 MG tablet Take 40 mg by mouth daily.    [provider]  B-COMPLEX-C PO Take 1 tablet by mouth daily.    [provider]  doxepin (SINEQUAN) 10 MG capsule Take 10 mg by mouth at bedtime as needed.    [provider]  enoxaparin (LOVENOX) 40 MG/0.4ML injection Inject 0.4 mLs (40 mg total) into the skin every 12 (twelve) hours. 09/15/18   Anson Oregon, PA-C  gabapentin (NEURONTIN) 300 MG capsule Take 300 mg by mouth 3 (three) times daily.    [provider]  haloperidol (HALDOL) 10 MG tablet Take 5-10 mg by mouth 3 (three) times daily. Take one tablet by mouth every morning, and one-half tablet every afternoon and one tablet every night at bedtime    [provider]  HYDROcodone-acetaminophen (NORCO/VICODIN) 5-325 MG tablet Take 1 tablet by mouth every 4 (four) hours as needed. 09/18/18   Minna Antis, MD  lisinopril-hydrochlorothiazide (PRINZIDE,ZESTORETIC) 20-12.5 MG tablet Take 1 tablet by mouth daily.    [provider]  METHADONE HCL PO Take 146 mg by mouth daily.    [provider]  Misc. Devices [facility]) MISC Please provide 1 wheelchair for the patient. 09/18/18   Minna Antis, MD  QUEtiapine (SEROQUEL XR) 400 MG 24 hr tablet 400 mg 2 (two) times daily.  03/13/17   [provider]  simvastatin (ZOCOR) 40 MG tablet Take 40 mg by mouth daily.    [provider]  sodium chloride (OCEAN) 0.65 % SOLN nasal spray Place 2 sprays into both nostrils every 2 (two) hours while awake. 01/18/18 02/17/18  Betancourt, Jarold Song, NP  traZODone (DESYREL) 100 MG tablet Take 300-400 mg by mouth at bedtime.     [provider]    Family History Family History  Problem Relation Age of Onset   Hypercholesterolemia Mother    Crohn's disease Father     Social History Social History   Tobacco Use   Smoking status: Every Day    Packs/day: 2.00    Years: 15.00    Additional pack years: 0.00    Total pack years: 30.00  Types: Cigarettes   Smokeless tobacco: Never  Vaping Use   Vaping Use: Never used  Substance Use Topics   Alcohol use: Not Currently   Drug use: Yes    Types: Cocaine     Allergies   Patient has no known allergies.   Review of Systems Review of Systems  All other systems reviewed and are negative.    Physical Exam Triage Vital Signs ED Triage Vitals  Enc Vitals Group     BP 12/11/22 1417 136/78     Pulse Rate 12/11/22 1417 90     Resp 12/11/22 1417 14     Temp 12/11/22 1417 97.6 F (36.4 C)     Temp Source 12/11/22 1417 Oral     SpO2 12/11/22 1417 94 %     Weight --      Height --      Head Circumference --      Peak Flow --      Pain Score 12/11/22 1420 10     Pain Loc --      Pain Edu? --      Excl. in GC? --    No data found.  Updated Vital Signs BP 136/78 (BP Location: Right Arm)   Pulse 90   Temp 97.6 F (36.4 C) (Oral)   Resp 14   SpO2 94%   Visual Acuity Right Eye Distance:   Left Eye Distance:   Bilateral Distance:    Right Eye Near:   Left Eye Near:    Bilateral Near:      Physical Exam Vitals and nursing note reviewed.  Constitutional:      Appearance: Normal appearance.  HENT:     Head: Normocephalic and atraumatic.  Musculoskeletal:     Cervical back: Normal range of motion and neck supple.     Comments: Physical swelling of the right ankle noted.  No visible or palpable deformity of the right ankle noted.  Palpable tenderness on the right ankle joint noted.  Palpable tenderness on medial malleoli lateral malleoli and anterior aspect of the ankle joint noted.  No ankle joint instability noted.  Range of motion of ankle joint preserved but painful.  Skin overlying right ankle joint unremarkable.    Skin:    Capillary Refill: Capillary refill takes 2 to 3 seconds.     Comments: Fine erythematous papular rash on both forearms, ventral wrist, and to ankles.  Pruritic.  Nontender.  Diffusely scattered.  1 to 2 mm diameter.  Excoriated.   consistent with scabies.  Neurological:     General: No focal deficit present.     Mental Status: He is alert and oriented to person, place, and time.  Psychiatric:        Mood and Affect: Mood normal.        Behavior: Behavior normal.      UC Treatments / Results  Labs (all labs ordered are listed, but only abnormal results are displayed) Labs Reviewed - No data to display  EKG   Radiology DG Ankle Complete Right  Result Date: 12/11/2022 CLINICAL DATA:  Swollen and painful ankle for 2 weeks. EXAM: RIGHT ANKLE - COMPLETE 3+ VIEW COMPARISON:  None Available. FINDINGS: Age-indeterminate mildly displaced fracture of the distal fibula. Distal tibia and talar dome are intact. No evidence of dislocation. There is regional soft tissue swelling. IMPRESSION: Age-indeterminate mildly displaced fracture of the distal fibula. Electronically Signed   By: Emmaline Kluver M.D.   On: 12/11/2022 15:12  Procedures Procedures (including critical care time)  Medications Ordered in UC Medications - No data to  display  Initial Impression / Assessment and Plan / UC Course  I have reviewed the triage vital signs and the nursing notes.  Pertinent labs & imaging results that were available during my care of the patient were reviewed by me and considered in my medical decision making (see chart for detaiL)  Final Clinical Impressions(s) / UC Diagnoses   Final diagnoses:  Acute right ankle pain  Scabies     Discharge Instructions      No acute x-ray abnormality noted.  Starting you on diclofenac as well as some prednisone.  Do not take any other NSAIDs while taking diclofenac.  Also prescribing permethrin for scabies.  Try to minimize weightbearing for right ankle.  Right ankle support would be helpful.     ED Prescriptions     Medication Sig Dispense Auth. Provider   permethrin (ELIMITE) 5 % cream Apply to affected area once 60 g Lura Em, MD   predniSONE (DELTASONE) 20 MG tablet Take 2 tablets (40 mg total) by mouth daily with breakfast for 5 days. 10 tablet Lura Em, MD   diclofenac (VOLTAREN) 75 MG EC tablet Take 1 tablet (75 mg total) by mouth 2 (two) times daily for 15 days. 30 tablet Lura Em, MD      PDMP not reviewed this encounter.   Lura Em, MD 12/11/22 1531    Lura Em, MD 12/11/22 1550    Lura Em, MD 12/11/22 0981

## 2022-12-11 NOTE — ED Triage Notes (Signed)
Pt reports he has a swollen and painful right ankle x 2 weeks . States it hurts worse with walking and just throbs in general.  Pt is unsure about injury  Pt also presents today with scabies x 7 months

## 2022-12-11 NOTE — Discharge Instructions (Addendum)
No acute x-ray abnormality noted.  Starting you on diclofenac as well as some prednisone.  Do not take any other NSAIDs while taking diclofenac.  Also prescribing permethrin for scabies.  Try to minimize weightbearing for right ankle.  Right ankle support would be helpful.

## 2023-02-11 ENCOUNTER — Ambulatory Visit
Admission: EM | Admit: 2023-02-11 | Discharge: 2023-02-11 | Disposition: A | Discharge status: Home / Self Care | Payer: Medicare Other | Attending: Physician Assistant | Admitting: Physician Assistant

## 2023-02-11 DIAGNOSIS — Z1152 Encounter for screening for COVID-19: Secondary | ICD-10-CM | POA: Insufficient documentation

## 2023-02-11 DIAGNOSIS — J019 Acute sinusitis, unspecified: Secondary | ICD-10-CM | POA: Insufficient documentation

## 2023-02-11 DIAGNOSIS — J441 Chronic obstructive pulmonary disease with (acute) exacerbation: Secondary | ICD-10-CM | POA: Diagnosis not present

## 2023-02-11 DIAGNOSIS — J029 Acute pharyngitis, unspecified: Secondary | ICD-10-CM | POA: Diagnosis not present

## 2023-02-11 LAB — SARS CORONAVIRUS 2 BY RT PCR: SARS Coronavirus 2 by RT PCR: NEGATIVE

## 2023-02-11 LAB — GROUP A STREP BY PCR: Group A Strep by PCR: NOT DETECTED

## 2023-02-11 MED ORDER — AMOXICILLIN-POT CLAVULANATE 875-125 MG PO TABS: 1.0000 | ORAL_TABLET | Freq: Two times a day (BID) | ORAL | 0 refills | Status: AC

## 2023-02-11 MED ORDER — PROMETHAZINE-DM 6.25-15 MG/5ML PO SYRP: 5.0000 mL | ORAL_SOLUTION | Freq: Four times a day (QID) | ORAL | 0 refills | Status: AC | PRN

## 2023-02-11 MED ORDER — PREDNISONE 20 MG PO TABS: 40.0000 mg | ORAL_TABLET | Freq: Every day | ORAL | 0 refills | Status: AC

## 2023-02-11 NOTE — ED Provider Notes (Signed)
[facility]    CSN: [id number] Arrival date & time: 02/11/23  1314      History   Chief Complaint Chief Complaint  Patient presents with   Nasal Congestion    HPI Richard Davis is a 47 y.o. male with history of COPD, hypertension, hyperlipidemia, CAD, schizoaffective disorder, and chronic methadone maintenance use.  Patient also has previous history of multiple sinus surgeries.  Patient presents today for 4-5 day history of cough, congestion, shortness of breath, runny nose, body aches, chills, sinus pressure/pain and sore throat.  Patient says he has not felt well for the past 1-1/2 weeks.  Patient unsure of any sick contacts.  Has been taking over-the-counter medication and using inhalers for symptoms.  HPI  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 09/09/2018   Avascular necrosis (HCC) 12/07/2016   Leg pain 10/23/2016   Left hip pain 10/05/2016   Hypogonadism in male 03/27/2016   Surgical complication involving left eye associated with non-ophthalmic procedure 06/17/2015   Hx of cardiac catheterization 02/03/2015   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   Chest pain with high risk for cardiac etiology 01/27/2015   Abnormal nuclear stress test 01/27/2015   Chronic pain 01/27/2015   GERD (gastroesophageal reflux disease) 01/27/2015   Hyperlipidemia 01/27/2015   Hypertension 01/27/2015   Joint pain 01/27/2015   Sinusitis 01/27/2015   Tobacco use 01/27/2015   SOB (shortness of breath) on exertion 12/31/2014   Gynecomastia 02/10/2013   Schizoaffective disorder (HCC) 06/04/2012    Past Surgical History:  Procedure Laterality Date   CARDIAC  CATHETERIZATION N/A 01/27/2015   Procedure: Left Heart Cath;  Surgeon: Marcina Millard, MD;  Location: [facility] INVASIVE CV LAB;  Service: Cardiovascular;  Laterality: N/A;   ETHMOIDECTOMY Left 06/17/2015   Procedure: ETHMOIDECTOMY REVISION and removal of polyps.;  Surgeon: Vernie Murders, MD;  Location: [facility];  Service: ENT;  Laterality: Left;   FRONTAL SINUS EXPLORATION Left 06/17/2015   Procedure: FRONTAL Sinusotomy with removal of polyps.;  Surgeon: Vernie Murders, MD;  Location: [facility];  Service: ENT;  Laterality: Left;   IMAGE GUIDED SINUS SURGERY N/A 06/17/2015   Procedure: IMAGE GUIDED SINUS SURGERY;  Surgeon: Vernie Murders, MD;  Location: [facility];  Service: ENT;  Laterality: N/A;  GAVE DISK TO [name] PROPEL Gave 2nd disk to cece 10-24 kp   JOINT REPLACEMENT     MAXILLARY ANTROSTOMY Left 06/17/2015   Procedure: ENDOSCOPIC MAXILLARY ANTROSTOMY WITH REMOVAL CONTENTS;  Surgeon: Vernie Murders, MD;  Location: [facility];  Service: ENT;  Laterality: Left;   ORIF PERIPROSTHETIC FRACTURE Left 09/12/2018   Procedure: OPEN REDUCTION INTERNAL FIXATION (ORIF) PERIPROSTHETIC FRACTURE;  Surgeon: Kennedy Bucker, MD;  Location: [facility] ORS;  Service: Orthopedics;  Laterality: Left;   POLYPECTOMY Left 06/17/2015   Procedure: POLYPECTOMY NASAL;  Surgeon: Vernie Murders, MD;  Location: [facility];  Service: ENT;  Laterality: Left;   TONSILLECTOMY         Home Medications    Prior to Admission medications   Medication Sig Start Date End Date Taking? Authorizing Provider  ADVAIR [facility] 115-21 MCG/ACT inhaler USE 2 INHALATIONS BID 03/30/17  Yes  [provider]  albuterol (PROVENTIL HFA;VENTOLIN HFA) 108 (90 BASE) MCG/ACT inhaler Inhale 2 puffs into the lungs every 6 (six) hours as needed for wheezing or shortness of breath.   Yes [provider]  amoxicillin-clavulanate (AUGMENTIN) 875-125 MG tablet Take 1 tablet by mouth every 12 (twelve) hours  for 7 days. 02/11/23 02/18/23 Yes Eusebio Friendly B, PA-C  ARIPiprazole (ABILIFY) 10 MG tablet Take by mouth. 11/21/22  Yes [provider]  atorvastatin (LIPITOR) 40 MG tablet Take 40 mg by mouth daily.   Yes [provider]  BELSOMRA 20 MG TABS Take 1 tablet by mouth at bedtime.   Yes [provider]  clonazePAM (KLONOPIN) 0.5 MG tablet Take by mouth. 01/12/22  Yes [provider]  doxepin (SINEQUAN) 10 MG capsule Take 10 mg by mouth at bedtime as needed.   Yes [provider]  furosemide (LASIX) 20 MG tablet Take by mouth. 11/02/20  Yes [provider]  lisinopril-hydrochlorothiazide (PRINZIDE,ZESTORETIC) 20-12.5 MG tablet Take 1 tablet by mouth daily.   Yes [provider]  predniSONE (DELTASONE) 20 MG tablet Take 2 tablets (40 mg total) by mouth daily for 5 days. 02/11/23 02/16/23 Yes Shirlee Latch, PA-C  promethazine-dextromethorphan (PROMETHAZINE-DM) 6.25-15 MG/5ML syrup Take 5 mLs by mouth 4 (four) times daily as needed. 02/11/23  Yes Eusebio Friendly B, PA-C  QUEtiapine (SEROQUEL XR) 400 MG 24 hr tablet 400 mg 2 (two) times daily.  03/13/17  Yes [provider]  traZODone (DESYREL) 100 MG tablet Take 300-400 mg by mouth at bedtime.    Yes [provider]  amitriptyline (ELAVIL) 100 MG tablet Take 50-100 mg by mouth at bedtime.     [provider]  B-COMPLEX-C PO Take 1 tablet by mouth daily.    [provider]  gabapentin (NEURONTIN) 300 MG capsule Take 300 mg by mouth 3 (three) times daily.    [provider]  haloperidol (HALDOL) 10 MG tablet Take 5-10 mg by mouth 3 (three) times daily. Take one tablet by mouth every morning, and one-half tablet every afternoon and one tablet every night at bedtime    [provider]  METHADONE HCL PO Take 146 mg by mouth daily.    [provider]  permethrin (ELIMITE) 5 % cream Apply to affected area once 12/11/22   Lura Em, MD   simvastatin (ZOCOR) 40 MG tablet Take 40 mg by mouth daily.    [provider]  sodium chloride (OCEAN) 0.65 % SOLN nasal spray Place 2 sprays into both nostrils every 2 (two) hours while awake. 01/18/18 02/17/18  Betancourt, Jarold Song, NP    Family History Family History  Problem Relation Age of Onset   Hypercholesterolemia Mother    Crohn's disease Father     Social History Social History   Tobacco Use   Smoking status: Every Day    Packs/day: 2.00    Years: 15.00    Additional pack years: 0.00    Total pack years: 30.00    Types: Cigarettes   Smokeless tobacco: Never  Vaping Use   Vaping Use: Never used  Substance Use Topics   Alcohol use: Not Currently   Drug use: Yes    Types: Cocaine     Allergies   Patient has no known allergies.   Review of Systems Review of Systems  Constitutional:  Positive for chills and fatigue. Negative for fever.  HENT:  Positive for congestion, rhinorrhea, sinus pressure, sinus pain and sore throat.  Respiratory:  Positive for cough and shortness of breath.   Cardiovascular:  Negative for chest pain.  Gastrointestinal:  Negative for abdominal pain, diarrhea, nausea and vomiting.  Musculoskeletal:  Positive for myalgias.  Neurological:  Negative for weakness, light-headedness and headaches.  Hematological:  Negative for adenopathy.     Physical Exam Triage Vital Signs ED Triage Vitals  Enc Vitals Group     BP      Pulse      Resp      Temp      Temp src      SpO2      Weight      Height      Head Circumference      Peak Flow      Pain Score      Pain Loc      Pain Edu?      Excl. in GC?    No data found.  Updated Vital Signs BP 109/68 (BP Location: Left Arm)   Pulse (!) 101   Temp 98 F (36.7 C) (Oral)   Resp 19   SpO2 93%   Physical Exam Vitals and nursing note reviewed.  Constitutional:      General: He is not in acute distress.    Appearance: Normal appearance. He is well-developed. He is not  ill-appearing.  HENT:     Head: Normocephalic and atraumatic.     Right Ear: Tympanic membrane, ear canal and external ear normal.     Left Ear: Tympanic membrane, ear canal and external ear normal.     Nose: Congestion present.     Mouth/Throat:     Mouth: Mucous membranes are moist.     Pharynx: Oropharynx is clear. Posterior oropharyngeal erythema present.  Eyes:     General: No scleral icterus.    Conjunctiva/sclera: Conjunctivae normal.  Cardiovascular:     Rate and Rhythm: Regular rhythm. Tachycardia present.     Heart sounds: Normal heart sounds.  Pulmonary:     Effort: Pulmonary effort is normal. No respiratory distress.     Breath sounds: Wheezing present.  Musculoskeletal:     Cervical back: Neck supple.  Skin:    General: Skin is warm and dry.     Capillary Refill: Capillary refill takes less than 2 seconds.  Neurological:     General: No focal deficit present.     Mental Status: He is alert. Mental status is at baseline.     Motor: No weakness.     Gait: Gait normal.  Psychiatric:        Mood and Affect: Mood normal.        Behavior: Behavior normal.      UC Treatments / Results  Labs (all labs ordered are listed, but only abnormal results are displayed) Labs Reviewed  SARS CORONAVIRUS 2 BY RT PCR  GROUP A STREP BY PCR    EKG   Radiology No results found.  Procedures Procedures (including critical care time)  Medications Ordered in UC Medications - No data to display  Initial Impression / Assessment and Plan / UC Course  I have reviewed the triage vital signs and the nursing notes.  Pertinent labs & imaging results that were available during my care of the patient were reviewed by me and considered in my medical decision making (see chart for details).   47 year old male with history of COPD, CAD, hypertension, substance abuse and chronic methadone maintenance therapy presents for 4-day history of productive cough, sinus  pain, congestion,  chills, body aches, sore throat and shortness of breath.  COVID and strep testing obtained.  All testing negative.  Discussed results with patient.  COPD exacerbation acute sinusitis.  Treating this time with Augmentin, prednisone, Promethazine DM.  Encouraged increased rest and fluids and continuing to use inhalers at home.  Reviewed return precautions.   Final Clinical Impressions(s) / UC Diagnoses   Final diagnoses:  COPD exacerbation (HCC)  Acute sinusitis, recurrence not specified, unspecified location  Sore throat   Discharge Instructions   None    ED Prescriptions     Medication Sig Dispense Auth. Provider   amoxicillin-clavulanate (AUGMENTIN) 875-125 MG tablet Take 1 tablet by mouth every 12 (twelve) hours for 7 days. 14 tablet Eusebio Friendly B, PA-C   predniSONE (DELTASONE) 20 MG tablet Take 2 tablets (40 mg total) by mouth daily for 5 days. 10 tablet Shirlee Latch, PA-C   promethazine-dextromethorphan (PROMETHAZINE-DM) 6.25-15 MG/5ML syrup Take 5 mLs by mouth 4 (four) times daily as needed. 118 mL Shirlee Latch, PA-C      PDMP not reviewed this encounter.   Shirlee Latch, PA-C 02/11/23 1423

## 2023-02-11 NOTE — ED Triage Notes (Signed)
Congestion, cough, SOB with exertion, runny nose with yellow mucus, body aches and chills, sinus pressure and pain x4 days, sore throat that started yesterday.

## 2023-04-07 ENCOUNTER — Ambulatory Visit
Admission: EM | Admit: 2023-04-07 | Discharge: 2023-04-07 | Disposition: A | Discharge status: Home / Self Care | Payer: Medicare Other | Source: Home / Self Care

## 2023-04-07 ENCOUNTER — Encounter: Payer: Self-pay | Admitting: Emergency Medicine

## 2023-04-07 DIAGNOSIS — J449 Chronic obstructive pulmonary disease, unspecified: Secondary | ICD-10-CM | POA: Insufficient documentation

## 2023-04-07 DIAGNOSIS — B9789 Other viral agents as the cause of diseases classified elsewhere: Secondary | ICD-10-CM

## 2023-04-07 DIAGNOSIS — J329 Chronic sinusitis, unspecified: Secondary | ICD-10-CM

## 2023-04-07 DIAGNOSIS — Z1152 Encounter for screening for COVID-19: Secondary | ICD-10-CM | POA: Insufficient documentation

## 2023-04-07 DIAGNOSIS — F17210 Nicotine dependence, cigarettes, uncomplicated: Secondary | ICD-10-CM | POA: Insufficient documentation

## 2023-04-07 DIAGNOSIS — I10 Essential (primary) hypertension: Secondary | ICD-10-CM | POA: Insufficient documentation

## 2023-04-07 LAB — SARS CORONAVIRUS 2 BY RT PCR: SARS Coronavirus 2 by RT PCR: NEGATIVE

## 2023-04-07 MED ORDER — PREDNISONE 10 MG PO TABS
ORAL_TABLET | ORAL | 0 refills | Status: AC
Start: 2023-04-07 — End: ?

## 2023-04-07 NOTE — ED Provider Notes (Signed)
[facility]    CSN: [id number] Arrival date & time: 04/07/23  1131      History   Chief Complaint Chief Complaint  Patient presents with   Nasal Congestion   Sinus Problem    HPI Richard Davis is a 47 y.o. male with history of COPD, HTN, HLD presents with 4-day history of headache, facial pain and pressure, nasal congestion and sore throat.  The headache is located in his forehead and cheeks.  He describes the pain as pressure.  He is blowing yellow mucus out of his nose.  He denies difficulty swallowing.  He denies runny nose, ear pain, cough, shortness of breath, vomiting or diarrhea.  He denies fever, chills or bodyaches.  He reports he has taken Zicam OTC with minimal relief of symptoms.  He has not had sick contacts recently but reports his mother had COVID 2 weeks ago.  He does smoke.  He was seen at this urgent care 02/2023 and treated with prednisone for viral sinus infection.  HPI  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 09/09/2018   Avascular necrosis (HCC) 12/07/2016   Leg pain 10/23/2016   Left hip pain 10/05/2016   Hypogonadism in male 03/27/2016   Surgical complication involving left eye associated with non-ophthalmic procedure 06/17/2015   Hx of cardiac catheterization 02/03/2015   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   Chest pain with high risk for cardiac etiology 01/27/2015   Abnormal nuclear stress test 01/27/2015   Chronic pain 01/27/2015   GERD (gastroesophageal reflux disease) 01/27/2015   Hyperlipidemia 01/27/2015   Hypertension 01/27/2015   Joint pain 01/27/2015   Sinusitis 01/27/2015   Tobacco use 01/27/2015   SOB  (shortness of breath) on exertion 12/31/2014   Gynecomastia 02/10/2013   Schizoaffective disorder (HCC) 06/04/2012    Past Surgical History:  Procedure Laterality Date   CARDIAC CATHETERIZATION N/A 01/27/2015   Procedure: Left Heart Cath;  Surgeon: Marcina Millard, MD;  Location: [facility];  Service: Cardiovascular;  Laterality: N/A;   ETHMOIDECTOMY Left 06/17/2015   Procedure: ETHMOIDECTOMY REVISION and removal of polyps.;  Surgeon: Vernie Murders, MD;  Location: [facility];  Service: ENT;  Laterality: Left;   FRONTAL SINUS EXPLORATION Left 06/17/2015   Procedure: FRONTAL Sinusotomy with removal of polyps.;  Surgeon: Vernie Murders, MD;  Location: [facility];  Service: ENT;  Laterality: Left;   IMAGE GUIDED SINUS SURGERY N/A 06/17/2015   Procedure: IMAGE GUIDED SINUS SURGERY;  Surgeon: Vernie Murders, MD;  Location: [facility];  Service: ENT;  Laterality: N/A;  GAVE DISK TO [name] PROPEL Gave 2nd disk to cece 10-24 kp   JOINT REPLACEMENT     MAXILLARY ANTROSTOMY Left 06/17/2015   Procedure: ENDOSCOPIC MAXILLARY ANTROSTOMY WITH REMOVAL CONTENTS;  Surgeon: Vernie Murders, MD;  Location: [facility];  Service: ENT;  Laterality: Left;   ORIF PERIPROSTHETIC FRACTURE Left 09/12/2018   Procedure: OPEN REDUCTION INTERNAL FIXATION (ORIF) PERIPROSTHETIC FRACTURE;  Surgeon: Kennedy Bucker, MD;  Location: [facility];  Service: Orthopedics;  Laterality: Left;   POLYPECTOMY Left 06/17/2015   Procedure: POLYPECTOMY NASAL;  Surgeon: Vernie Murders, MD;  Location: [facility];  Service: ENT;  Laterality: Left;   TONSILLECTOMY         Home Medications    Prior to Admission medications   Medication Sig Start Date End Date Taking? Authorizing Provider  predniSONE (DELTASONE) 10 MG tablet Take 6 tabs on day 1, 5 tabs on day 2, 4 tabs on day 3, 3 tabs on day 4, 2 tabs on day 5, 1 tab on day 6 04/07/23  Yes Lorre Munroe, NP  ADVAIR [facility] 115-21 MCG/ACT inhaler USE  2 INHALATIONS BID 03/30/17   [provider]  albuterol (PROVENTIL HFA;VENTOLIN HFA) 108 (90 BASE) MCG/ACT inhaler Inhale 2 puffs into the lungs every 6 (six) hours as needed for wheezing or shortness of breath.    [provider]  amitriptyline (ELAVIL) 100 MG tablet Take 50-100 mg by mouth at bedtime.     [provider]  ARIPiprazole (ABILIFY) 10 MG tablet Take by mouth. 11/21/22   [provider]  atorvastatin (LIPITOR) 40 MG tablet Take 40 mg by mouth daily.    [provider]  B-COMPLEX-C PO Take 1 tablet by mouth daily.    [provider]  BELSOMRA 20 MG TABS Take 1 tablet by mouth at bedtime.    [provider]  clonazePAM (KLONOPIN) 0.5 MG tablet Take by mouth. 01/12/22   [provider]  doxepin (SINEQUAN) 10 MG capsule Take 10 mg by mouth at bedtime as needed.    [provider]  furosemide (LASIX) 20 MG tablet Take by mouth. 11/02/20   [provider]  gabapentin (NEURONTIN) 300 MG capsule Take 300 mg by mouth 3 (three) times daily.    [provider]  haloperidol (HALDOL) 10 MG tablet Take 5-10 mg by mouth 3 (three) times daily. Take one tablet by mouth every morning, and one-half tablet every afternoon and one tablet every night at bedtime    [provider]  lisinopril-hydrochlorothiazide (PRINZIDE,ZESTORETIC) 20-12.5 MG tablet Take 1 tablet by mouth daily.    [provider]  METHADONE HCL PO Take 146 mg by mouth daily.    [provider]  permethrin (ELIMITE) 5 % cream Apply to affected area once 12/11/22   Lura Em, MD  promethazine-dextromethorphan (PROMETHAZINE-DM) 6.25-15 MG/5ML syrup Take 5 mLs by mouth 4 (four) times daily as needed. 02/11/23   Shirlee Latch, PA-C  QUEtiapine (SEROQUEL XR) 400 MG 24 hr tablet 400 mg 2 (two) times daily.  03/13/17   [provider]  simvastatin (ZOCOR) 40 MG tablet Take 40 mg by mouth daily.    [provider]  sodium chloride (OCEAN) 0.65 % SOLN nasal spray Place 2 sprays into both nostrils every 2 (two) hours while awake. 01/18/18 02/17/18  Betancourt, Jarold Song, NP  traZODone (DESYREL) 100 MG tablet Take 300-400 mg by mouth at bedtime.     [provider]    Family History Family History  Problem Relation Age of Onset   Hypercholesterolemia Mother    Crohn's disease Father     Social History Social History   Tobacco Use   Smoking status: Every Day    Current packs/day: 2.00    Average packs/day: 2.0 packs/day for 15.0 years (30.0 ttl pk-yrs)    Types: Cigarettes   Smokeless tobacco: Never  Vaping Use   Vaping status: Never Used  Substance Use Topics   Alcohol use: Not Currently   Drug use: Yes    Types: Cocaine     Allergies   Patient  has no known allergies.   Review of Systems Review of Systems  Constitutional:  Positive for fatigue. Negative for chills and fever.  HENT:  Positive for congestion, sinus pressure, sinus pain and sore throat. Negative for ear pain, rhinorrhea and trouble swallowing.   Respiratory:  Negative for cough, chest tightness and shortness of breath.   Cardiovascular:  Negative for chest pain.  Gastrointestinal:  Negative for diarrhea, nausea and vomiting.  Musculoskeletal:  Positive for arthralgias. Negative for myalgias.  Skin:  Negative for rash.  Neurological:  Positive for headaches. Negative for dizziness, weakness and light-headedness.     Physical Exam Triage Vital Signs ED Triage Vitals  Encounter Vitals Group     BP 04/07/23 1159 110/65     Systolic BP Percentile --      Diastolic BP Percentile --      Pulse Rate 04/07/23 1159 92     Resp 04/07/23 1159 15     Temp 04/07/23 1159 97.8 F (36.6 C)     Temp Source 04/07/23 1159 Oral     SpO2 04/07/23 1159 97 %     Weight 04/07/23 1155 210 lb (95.3 kg)     Height 04/07/23 1155 5\' 11"  (1.803 m)     Head Circumference --      Peak Flow --      Pain Score  04/07/23 1155 5     Pain Loc --      Pain Education --      Exclude from Growth Chart --    No data found.  Updated Vital Signs BP 110/65 (BP Location: Right Arm)   Pulse 92   Temp 97.8 F (36.6 C) (Oral)   Resp 15   Ht 5\' 11"  (1.803 m)   Wt 210 lb (95.3 kg)   SpO2 97%   BMI 29.29 kg/m      Physical Exam Constitutional:      General: He is not in acute distress. HENT:     Head: Normocephalic.     Comments: Frontal and maxillary sinus pressure noted    Nose: Congestion present.     Mouth/Throat:     Mouth: Mucous membranes are moist.     Pharynx: Posterior oropharyngeal erythema present. No oropharyngeal exudate.  Eyes:     Extraocular Movements: Extraocular movements intact.     Conjunctiva/sclera: Conjunctivae normal.     Pupils: Pupils are equal, round, and reactive to light.  Cardiovascular:     Rate and Rhythm: Normal rate and regular rhythm.     Heart sounds: Normal heart sounds.  Pulmonary:     Effort: Pulmonary effort is normal.     Breath sounds: No wheezing, rhonchi or rales.     Comments: Diminished breath sounds throughout Lymphadenopathy:     Cervical: No cervical adenopathy.  Skin:    General: Skin is warm and dry.     Findings: No rash.  Neurological:     General: No focal deficit present.     Mental Status: He is alert and oriented to person, place, and time.      UC Treatments / Results  Labs    EKG   Radiology   Procedures   Medications Ordered in UC Medications - No data to display  Initial Impression / Assessment and Plan / UC Course  I have reviewed the triage vital signs and the nursing notes.  Pertinent labs & imaging results that were available during my care of the patient were reviewed by me  and considered in my medical decision making (see chart for details).     47 year old with history of COPD with 4-day history of some pain and pressure, nasal congestion and sore throat.  COVID negative.  Exam consistent with  viral sinusitis.  Rx for Pred taper x 6 days for symptom management.  He does not like to use nasal steroids.  Discussed why antibiotics were not indicated at this time.  Encouraged rest and fluids.  Advised him to follow-up if symptoms persist or worsen.  Final Clinical Impressions(s) / UC Diagnoses   Final diagnoses:  Viral sinusitis     Discharge Instructions      You were seen today for sinus pressure, nasal congestion and sore throat.  Your COVID test is pending and you will be called with any positive result.  Your exam is consistent with viral sinusitis.  We will treat this with oral steroids x 6 days since you are unable to tolerate nasal steroids.  Antibiotics are not indicated at this time.  Please follow-up if your symptoms persist or worsen.     ED Prescriptions     Medication Sig Dispense Auth. Provider   predniSONE (DELTASONE) 10 MG tablet Take 6 tabs on day 1, 5 tabs on day 2, 4 tabs on day 3, 3 tabs on day 4, 2 tabs on day 5, 1 tab on day 6 21 tablet Jackie Russman, Salvadore Oxford, NP      PDMP not reviewed this encounter.   Lorre Munroe, NP 04/07/23 1235

## 2023-04-07 NOTE — ED Triage Notes (Signed)
Patient c/o sinus pressure and congestion, nasal congestion that started 4 days ago.  Patient unsure of fevers.

## 2023-04-07 NOTE — Discharge Instructions (Signed)
You were seen today for sinus pressure, nasal congestion and sore throat.  Your COVID test is pending and you will be called with any positive result.  Your exam is consistent with viral sinusitis.  We will treat this with oral steroids x 6 days since you are unable to tolerate nasal steroids.  Antibiotics are not indicated at this time.  Please follow-up if your symptoms persist or worsen.

## 2023-09-04 ENCOUNTER — Encounter: Payer: Self-pay | Admitting: Family Medicine

## 2023-09-04 DIAGNOSIS — R634 Abnormal weight loss: Secondary | ICD-10-CM

## 2023-09-05 ENCOUNTER — Other Ambulatory Visit: Payer: Self-pay | Admitting: Family Medicine

## 2023-09-05 DIAGNOSIS — R634 Abnormal weight loss: Secondary | ICD-10-CM

## 2023-09-05 DIAGNOSIS — R948 Abnormal results of function studies of other organs and systems: Secondary | ICD-10-CM

## 2023-09-12 ENCOUNTER — Ambulatory Visit
Admission: RE | Admit: 2023-09-12 | Discharge: 2023-09-12 | Disposition: A | Discharge status: Home / Self Care | Payer: Medicare Other | Source: Ambulatory Visit | Attending: Family Medicine | Admitting: Family Medicine

## 2023-09-12 DIAGNOSIS — R634 Abnormal weight loss: Secondary | ICD-10-CM | POA: Insufficient documentation

## 2023-09-12 DIAGNOSIS — R948 Abnormal results of function studies of other organs and systems: Secondary | ICD-10-CM | POA: Insufficient documentation

## 2023-09-12 MED ORDER — IOHEXOL 300 MG/ML  SOLN
100.0000 mL | Freq: Once | INTRAMUSCULAR | Status: AC | PRN
  Administered 2023-09-12: 100 mL via INTRAVENOUS

## 2023-10-25 ENCOUNTER — Emergency Department

## 2023-10-25 ENCOUNTER — Other Ambulatory Visit: Payer: Self-pay

## 2023-10-25 ENCOUNTER — Encounter: Payer: Self-pay | Admitting: *Deleted

## 2023-10-25 ENCOUNTER — Emergency Department
Admission: EM | Admit: 2023-10-25 | Discharge: 2023-10-25 | Discharge status: Against Medical Advice | Attending: Emergency Medicine | Admitting: Emergency Medicine

## 2023-10-25 DIAGNOSIS — S0990XA Unspecified injury of head, initial encounter: Secondary | ICD-10-CM | POA: Diagnosis present

## 2023-10-25 DIAGNOSIS — S0081XA Abrasion of other part of head, initial encounter: Secondary | ICD-10-CM | POA: Diagnosis not present

## 2023-10-25 DIAGNOSIS — Z5321 Procedure and treatment not carried out due to patient leaving prior to being seen by health care provider: Secondary | ICD-10-CM | POA: Insufficient documentation

## 2023-10-25 DIAGNOSIS — M79602 Pain in left arm: Secondary | ICD-10-CM | POA: Insufficient documentation
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
## 2023-12-27 NOTE — ED Provider Notes (Signed)
 ED Progress Note  Dec 27, 2023   Assumed care of patient at 0900  Richard Davis is a 48 y/o M with hx most notable for CAD, HTN, HLD, Opioid use disorder, cocaine use disorder and schizoaffective disorder presenting to the ED on 5/21 with complaint of suicidal ideation. Medical workup notable for UDS + cocaine, cannabinoids and buprenorphine. Rare bacteria on U/A. Otherwise wnl. He is pending evaluation from psychiatry.   Will order gonorrhea and chlamydia testing given bacteria on U/A w/o nitrites.  Will restart home buperenorphine/naloxone 8/2   UPDATES  2:10 PM Patient has been evaluated by psychiatry. Not recommending psychiatric hospitalization a this time. Recommend substance use treatment. Plan to discharge.
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
## 2024-02-06 NOTE — ED Notes (Signed)
 LWBS/AMA Patient Follow-Up Call I attempted to call patient at [phone]  due to recent LWBS after triage from the ED. Patient did not answer at listed phone number at this time. Unable to leave a voicemail.
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
